# Patient Record
Sex: Male | Born: 1935 | Race: White | Hispanic: No | Smoking: Never smoker
Health system: Southern US, Community
[De-identification: ages and names within clinical notes are randomized; demographics above are authoritative.]

## PROBLEM LIST (undated history)

## (undated) DIAGNOSIS — E785 Hyperlipidemia, unspecified: Secondary | ICD-10-CM

## (undated) DIAGNOSIS — F329 Major depressive disorder, single episode, unspecified: Secondary | ICD-10-CM

## (undated) DIAGNOSIS — Z7901 Long term (current) use of anticoagulants: Secondary | ICD-10-CM

## (undated) DIAGNOSIS — I10 Essential (primary) hypertension: Secondary | ICD-10-CM

## (undated) DIAGNOSIS — I259 Chronic ischemic heart disease, unspecified: Secondary | ICD-10-CM

## (undated) DIAGNOSIS — I4891 Unspecified atrial fibrillation: Secondary | ICD-10-CM

## (undated) DIAGNOSIS — F32A Depression, unspecified: Secondary | ICD-10-CM

## (undated) DIAGNOSIS — E119 Type 2 diabetes mellitus without complications: Secondary | ICD-10-CM

## (undated) DIAGNOSIS — I2510 Atherosclerotic heart disease of native coronary artery without angina pectoris: Secondary | ICD-10-CM

## (undated) HISTORY — PX: CORONARY ARTERY BYPASS GRAFT: SHX141

## (undated) HISTORY — DX: Type 2 diabetes mellitus without complications: E11.9

## (undated) HISTORY — DX: Unspecified atrial fibrillation: I48.91

## (undated) HISTORY — DX: Chronic ischemic heart disease, unspecified: I25.9

## (undated) HISTORY — PX: TONSILLECTOMY AND ADENOIDECTOMY: SUR1326

## (undated) HISTORY — DX: Major depressive disorder, single episode, unspecified: F32.9

## (undated) HISTORY — DX: Hyperlipidemia, unspecified: E78.5

## (undated) HISTORY — DX: Long term (current) use of anticoagulants: Z79.01

## (undated) HISTORY — DX: Essential (primary) hypertension: I10

## (undated) HISTORY — DX: Depression, unspecified: F32.A

## (undated) HISTORY — DX: Atherosclerotic heart disease of native coronary artery without angina pectoris: I25.10

---

## 1987-09-03 HISTORY — PX: CARDIAC CATHETERIZATION: SHX172

## 1997-10-26 ENCOUNTER — Other Ambulatory Visit: Admission: RE | Admit: 1997-10-26 | Discharge: 1997-10-26 | Payer: Self-pay | Admitting: Cardiology

## 1998-09-16 HISTORY — PX: DOPPLER ECHOCARDIOGRAPHY: SHX263

## 1999-03-05 ENCOUNTER — Emergency Department (HOSPITAL_COMMUNITY): Admission: EM | Admit: 1999-03-05 | Discharge: 1999-03-05 | Payer: Self-pay | Admitting: Emergency Medicine

## 1999-03-13 ENCOUNTER — Emergency Department (HOSPITAL_COMMUNITY): Admission: EM | Admit: 1999-03-13 | Discharge: 1999-03-13 | Payer: Self-pay | Admitting: Emergency Medicine

## 2000-03-03 ENCOUNTER — Other Ambulatory Visit: Admission: RE | Admit: 2000-03-03 | Discharge: 2000-03-03 | Payer: Self-pay | Admitting: Urology

## 2000-03-03 ENCOUNTER — Encounter (INDEPENDENT_AMBULATORY_CARE_PROVIDER_SITE_OTHER): Payer: Self-pay | Admitting: Specialist

## 2000-03-10 ENCOUNTER — Encounter: Admission: RE | Admit: 2000-03-10 | Discharge: 2000-06-08 | Payer: Self-pay | Admitting: Radiation Oncology

## 2001-04-12 ENCOUNTER — Encounter: Payer: Self-pay | Admitting: Orthopedic Surgery

## 2001-04-12 ENCOUNTER — Ambulatory Visit (HOSPITAL_COMMUNITY): Admission: RE | Admit: 2001-04-12 | Discharge: 2001-04-12 | Payer: Self-pay | Admitting: Orthopedic Surgery

## 2001-05-12 ENCOUNTER — Observation Stay (HOSPITAL_COMMUNITY): Admission: RE | Admit: 2001-05-12 | Discharge: 2001-05-13 | Payer: Self-pay | Admitting: Orthopedic Surgery

## 2003-02-27 ENCOUNTER — Encounter: Payer: Self-pay | Admitting: Orthopedic Surgery

## 2003-03-05 ENCOUNTER — Observation Stay (HOSPITAL_COMMUNITY): Admission: RE | Admit: 2003-03-05 | Discharge: 2003-03-06 | Payer: Self-pay | Admitting: Orthopedic Surgery

## 2003-04-10 ENCOUNTER — Ambulatory Visit (HOSPITAL_COMMUNITY): Admission: RE | Admit: 2003-04-10 | Discharge: 2003-04-11 | Payer: Self-pay | Admitting: Ophthalmology

## 2004-02-07 ENCOUNTER — Ambulatory Visit (HOSPITAL_COMMUNITY): Admission: RE | Admit: 2004-02-07 | Discharge: 2004-02-07 | Payer: Self-pay | Admitting: Gastroenterology

## 2005-06-08 HISTORY — PX: CORONARY STENT PLACEMENT: SHX1402

## 2005-09-26 ENCOUNTER — Inpatient Hospital Stay (HOSPITAL_COMMUNITY): Admission: EM | Admit: 2005-09-26 | Discharge: 2005-09-30 | Payer: Self-pay | Admitting: Emergency Medicine

## 2005-09-28 HISTORY — PX: CARDIAC CATHETERIZATION: SHX172

## 2006-07-16 HISTORY — PX: CARDIOVASCULAR STRESS TEST: SHX262

## 2007-05-10 ENCOUNTER — Ambulatory Visit (HOSPITAL_COMMUNITY): Admission: RE | Admit: 2007-05-10 | Discharge: 2007-05-10 | Payer: Self-pay | Admitting: Ophthalmology

## 2010-02-14 ENCOUNTER — Ambulatory Visit: Payer: Self-pay | Admitting: Cardiology

## 2010-02-14 ENCOUNTER — Encounter: Admission: RE | Admit: 2010-02-14 | Discharge: 2010-02-14 | Payer: Self-pay | Admitting: Cardiology

## 2010-06-26 ENCOUNTER — Ambulatory Visit: Payer: Self-pay | Admitting: Cardiology

## 2010-10-21 NOTE — Op Note (Signed)
NAME:  Rodney Montes, Rodney Montes             ACCOUNT NO.:  [id number]   MEDICAL RECORD NO.:  [id number]          PATIENT TYPE:  AMB   LOCATION:  SDS                          FACILITY:  [facility]   PHYSICIAN:  Beulah Gandy. Ashley Royalty, M.D. DATE OF BIRTH:  [birth year]   DATE OF PROCEDURE:  05/10/2007  DATE OF DISCHARGE:                               OPERATIVE REPORT   ADMISSION DIAGNOSIS:  Vitreous hemorrhage, left eye.   PROCEDURES:  Pars plana vitrectomy with 25-gauge system, retinal  photocoagulation, left eye.   SURGEON:  Beulah Gandy. Ashley Royalty, M.D.   ASSISTANT:  Bryan Lemma. Lundquist, P.A.   ANESTHESIA:  General.   DETAILS:  Usual prep and drape, 25-gauge trocars placed at 10, 2 and  o'clock 4 mm back from the limbus.  The infusion was placed at 4  o'clock.  Provisc placed on the corneal surface.  The lighted pick and  the vitreous cutter were positioned in the eye and the pars plana  vitrectomy was begun just behind the crystalline lens.  Blood and  vitreous were encountered and carefully removed under low suction and  rapid cutting.  The vitrectomy was carried down to the macular surface,  where the vitreous was trimmed.  The vitrectomy was carried down to the  vitreous base for 360 degrees and blood was removed from the surface of  the scleral buckle.  A 360-degree scleral buckle was in place.  Once  this was accomplished, the endolaser was positioned in the eye and 907  burns were placed around the retinal periphery with a power of 1000  milliwatts, 1000 microns each and 0.1 second each.  A washout procedure  was performed.  The instruments were removed from the eye, the 25-gauge  trocars were removed.  Polymyxin and gentamicin were irrigated into  Tenon's space.  Marcaine was injected around the globe for postop pain.  TobraDex ophthalmic ointment, a patch and shield were placed, Decadron  10 mg was injected into the lower subconjunctival space.  Closing  pressure was 15 with a Barraquer  tonometer.   COMPLICATIONS:  None.   DURATION:  25 minutes.   The patient was awakened and taken to recovery in satisfactory  condition.      Beulah Gandy. Ashley Royalty, M.D.  Electronically Signed     JDM/MEDQ  D:  05/10/2007  T:  05/11/2007  Job:  [id number]

## 2010-10-24 NOTE — Discharge Summary (Signed)
NAME:  Rodney Montes, HUFSTETLER NO.:  [id number]   MEDICAL RECORD NO.:  [id number]          PATIENT TYPE:  INP   LOCATION:  6529                         FACILITY:  [facility]   PHYSICIAN:  Vesta Mixer, M.D. DATE OF BIRTH:  August 03, 1922   DATE OF ADMISSION:  09/26/2005  DATE OF DISCHARGE:  09/30/2005                                 DISCHARGE SUMMARY   DISCHARGE DIAGNOSES:  1.  Coronary artery disease.  He is status post PTCA and stenting of the      saphenous vein graft to the right coronary artery.  2.  History of coronary artery bypass grafting.  3.  Hyperlipidemia.  4.  Hypertension.  5.  History of chronic anticoagulation.   DISCHARGE MEDICATIONS:  1.  Aspirin 81 mg a day.  2.  Multivitamin once a day.  3.  Zetia 10 mg a day.  4.  Colace 100 mg a day.  5.  Simvastatin 40 mg a day.  6.  Toprol XL 50 mg a day.  7.  Lasix 20 mg every other day.  8.  Plavix 75 mg a day.  9.  Nitroglycerin as needed.  10. Coumadin as directed by the [place].   Patient will return to see Dr. Patty Sermons in approximately one to two weeks.  He can see Dr. Elease Hashimoto as well.   HISTORY:  Mr. [name] is an 77 year old gentleman with a history of coronary  artery disease.  He was admitted for symptoms of unstable angina.  Please  see dictated H&P for further details.   HOSPITAL COURSE:  CHEST PAIN:  The patient was supposed to be electively  admitted on Monday for angioplasty.  He ended up developing unstable angina  and was admitted on Sunday.  He underwent diagnostic heart catheterization  on Monday and was found to have a tight stenosis in the saphenous vein graft  to the right coronary artery.  Because of his age and because of the amount  of dye used the procedure was staged.  We did the procedure with angioplasty  on the following day.  He underwent successful PTCA and stenting of the  saphenous vein graft.  We used a filter wire to catch the debris from the  old graft.  The patient tolerated  the procedure quite well and did not have  any episodes of chest pain or shortness of breath.  He is discharged now in  satisfactory condition.  We have  added Plavix.  We will add Coumadin back to his medical regimen starting  tonight.  We have also changed his Lovastatin to simvastatin and I have  changed his Corgard to Toprol XL.  He will return to see Dr. Elease Hashimoto or Dr.  Patty Sermons in one to two weeks.           ______________________________  Vesta Mixer, M.D.     PJN/MEDQ  D:  09/30/2005  T:  10/01/2005  Job:  [id number]   cc:   Cassell Clement, M.D.  Fax: [phone]

## 2010-10-24 NOTE — H&P (Signed)
NAME:  LOUISE, VICTORY NO.:  [id number]   MEDICAL RECORD NO.:  [id number]          PATIENT TYPE:  INP   LOCATION:  1825                         FACILITY:  [facility]   PHYSICIAN:  Peter M. Swaziland, M.D.  DATE OF BIRTH:  08/21/1922   DATE OF ADMISSION:  09/26/2005  DATE OF DISCHARGE:                                HISTORY & PHYSICAL   For complete history and physical, past medical history, social history and  family history, please refer to dictated H&P of September 23, 2005 by Dr. Kristeen Miss. Mr. Bembenek is an 77 year old white male who presents to the  emergency room this morning with chest pain refractory to nitroglycerin at  home. He has had progressive angina for the past 2 months with exertion. He  is status post coronary artery bypass surgery x3 18 years ago. He saw Dr.  Elease Hashimoto on September 23, 2005 and was set up for cardiac catheterization on  Monday. His Coumadin has been on hold. Yesterday he had chest pain  throughout the day at rest. This was relieved temporarily with sublingual  nitroglycerin. This morning he again had refractory chest pain taking up to  5 sublingual nitroglycerin without relief of this pain. He presented to the  emergency room. He is currently pain free. Again for details of past medical  history, social history, and family history see other H&P.   PHYSICAL EXAMINATION:  GENERAL:  The patient is an elderly white male in no  distress.  VITAL SIGNS:  Blood pressure 143/81, pulse is 101 and irregular. He is  afebrile. Sats 99% on 2 liters nasal cannula.  HEENT:  Normocephalic, atraumatic. Pupils equal, round, reactive to light  and accommodation. Extraocular movements are full. Oropharynx is clear.  NECK:  Without JVD, adenopathy, thyromegaly or bruits.  LUNGS:  Clear.  CARDIAC:  Exam reveals irregular rate and rhythm without gallop, murmur, rub  or click.  ABDOMEN:  Soft, nontender without masses or bruits.  EXTREMITIES:  Without edema.  Pulses were 2+ and symmetric.  NEUROLOGIC:  Exam is nonfocal.   LABORATORY DATA:  ECG shows atrial fibrillation with diffuse ST-T wave  changes that are nonspecific. Sodium is 139, potassium 4.0, chloride 108,  CO2 22, BUN 14, creatinine 0.9, glucose 143, hemoglobin 15.6. Point of care  cardiac enzymes are negative x2. Chest x-ray shows mild right lower lobe  atelectasis.   IMPRESSION:  1.  Unstable angina status post CABG 18 years ago.  2.  Chronic atrial fibrillation.  3.  Hypertension  4.  Hypercholesterolemia.  5.  Prior transient ischemic attack.   PLAN:  The patient will be admitted to telemetry monitoring and will rule  out for myocardial infarction. He will be treated with IV nitroglycerin, IV  heparin. Will plan on proceeding with cardiac catheterization on Monday per  Dr. Elease Hashimoto.           ______________________________  Peter M. Swaziland, M.D.     PMJ/MEDQ  D:  09/26/2005  T:  09/26/2005  Job:  [id number]   cc:   Cassell Clement, M.D.  Fax: [phone]   Deloris Ping.  Nahser, M.D.  Fax: [phone]

## 2010-10-24 NOTE — Cardiovascular Report (Signed)
NAME:  Rodney Montes, Rodney Montes NO.:  [id number]   MEDICAL RECORD NO.:  [id number]          PATIENT TYPE:  INP   LOCATION:  4714                         FACILITY:  [facility]   PHYSICIAN:  Vesta Mixer, M.D. DATE OF BIRTH:  [birth year]   DATE OF PROCEDURE:  09/28/2005  DATE OF DISCHARGE:                              CARDIAC CATHETERIZATION   HISTORY OF PRESENT ILLNESS:  Rodney Montes is an 77 year old gentleman  with history of coronary artery bypass grafting by Dr. Particia Lather in  1989.  He has been doing fairly well but started having some episodes of  chest pain several weeks ago.  He was seen in the office last Wednesday.  His Coumadin was stopped, and he was scheduled to have a heart  catheterization on Monday.  He presented with unstable angina over the  weekend and had a very small bump in his cardiac enzymes.  He is scheduled  now for heart catheterization.   PROCEDURES:  Left heart catheterization with coronary angiography.  The  right femoral artery was easily cannulated using the modified Seldinger  technique.  There was a little bit of difficulty getting the wire up into  the central circulation, and he developed a small to moderate sized hematoma  at the end of the case.   HEMODYNAMIC DATA:  LV pressure is 115/8 with an aortic pressure of 115/60.   ANGIOGRAPHY:  THE LEFT MAIN:  The left main coronary artery has minor  luminal irregularities.  THE LEFT ANTERIOR DESCENDING ARTERY:  Gives off a septal perforator branch  and then a very small diagonal branch and then it is occluded.  THE LEFT CIRCUMFLEX ARTERY:  A moderate to large vessel.  There is a diffuse  70% stenosis at the takeoff of the circumflex artery followed by a 70-80%  complex lesion at the takeoff of the small first obtuse marginal branch.  The circumflex artery then continues with diffuse 60-70% stenosis.  It is  then occluded just after giving off the second obtuse marginal branch.  There  is a very tiny branch that travels around the AV groove, but it does  not supply any significant circulation to the left ventricle.  RIGHT CORONARY ARTERY:  Occluded proximally.  Saphenous vein graft to the  first obtuse marginal artery is a very nice graft. The anastomosis to the  first OM is normal, but the marginal vessel has significant irregularities.  The marginal branch is subtotally occluded just proximal to the anastomosis.  Slightly proximal to the anastomosis is a small branch with an 80-90%  stenosis at its takeoff.  This branch would be very difficult to wire in  that the wire would have to go up the first OM in a retrograde fashion and  then back down the OM vessel itself.  I do not think that this lesion is  approachable by angioplasty.  Furthermore, the vessel was probably 1.5 mm in  size.  First obtuse marginal is occluded distal to the anastomosis after  about 2-3 cm.  The right coronary artery is a very large vessel.  There is a  mid  stenosis of approximately 90%.  There is some haziness associated with  this lesion.  The anastomosis of the right coronary artery is fairly normal.  There are minor luminal irregularities in the right coronary artery.  The  posterior descending artery and the posterior lateral segment artery have  minor luminal irregularities but are otherwise patent.  This is a fairly  large system.  LEFT INTERNAL MAMMARY ARTERY:  The left internal mammary artery is a nice  vessel.  The anastomosis of the LAD is normal.  The LAD has minor luminal  irregularities.   STUDIES:  A left ventriculogram was performed in a 30 REO position.  It  revealed normal left ventricular systolic function with an ejection fraction  of 60%.   COMPLICATIONS:  None.   CONCLUSIONS:  1.  Severe native coronary artery disease.  He has patent grafts to the      first obtuse marginal artery, although the first OM has significant      disease just distal to the graft site.  2.   Saphenous vein graft to the right coronary artery has a tight 90%      stenosis.  We will anticipate doing a PCI tomorrow.  We have used a      considerable amount of contrast.  I anticipate that this procedure will      be somewhat longer than average.  We will need to use a filter wire so      that the anatomy is favorable.  In addition, he is 77 years old and has      had some risk for renal problems if we use too much contrast.  3.  Left internal mammary artery is a very nice graft.  He has normal left      ventricular systolic function.           ______________________________  Vesta Mixer, M.D.     PJN/MEDQ  D:  09/28/2005  T:  09/29/2005  Job:  [id number]

## 2010-10-24 NOTE — H&P (Signed)
NAME:  Rodney Montes, Rodney Montes NO.:  [id number]   MEDICAL RECORD NO.:  [id number]           PATIENT TYPE:   LOCATION:                                 FACILITY:   PHYSICIAN:  Vesta Mixer, M.D.      DATE OF BIRTH:   DATE OF ADMISSION:  09/28/2005  DATE OF DISCHARGE:                                HISTORY & PHYSICAL   Mr. Fell is an 77 year old gentleman with a history of coronary artery  disease.  He is status post coronary artery bypass grafting back in 1989.  He also has a history of chronic atrial fibrillation.  He is followed for  his Coumadin checks down in the [place].   He has been having some episodes of angina recently.  He states that the  angina originally was occurring with moderate exertion, but now he has been  having episodes of chest pain with very minimal exertion.  He took several  nitroglycerin, which helped transiently.  This morning he had chest pain  just walking from room to room.  He called Dr. Patty Sermons to be seen.   CURRENT MEDICATIONS:  1.  Coumadin as directed by the [place].  2.  Corgard 20 mg a day.  3.  Aspirin once daily.  4.  Lasix 20 mg every other day.  5.  Lovastatin 40 mg a day.  6.  Multivitamins once daily.  7.  Zetia 10 mg a day.  8.  Toprol XL 50 mg a day.   ALLERGIES:  None.   PAST MEDICAL HISTORY:  1.  History of coronary artery disease, status post CABG.  2.  History of a TIA.  3.  Chronic atrial fibrillation.  4.  Hypertension.  5.  Hyperlipidemia.   SOCIAL HISTORY:  The patient quit smoking many years ago.  He does not drink  alcohol to excess.  He used to work in the [employer]  business.   FAMILY HISTORY:  Noncontributory.   REVIEW OF SYSTEMS:  The patient denies any problems with his eyes, ears,  nose, and throat.  He denies any heat or cold intolerance.  He denies any  weight gain or weight loss.  He denies any PND, orthopnea, syncope, or  presyncope.  He does have chest pain and shortness  of breath, as noted  above.  He denies any diaphoresis.  He denies any problems with his GU or GI  system.  He denies any easy bruisability.  His review of systems was  reviewed and is otherwise negative.   PHYSICAL EXAMINATION:  VITAL SIGNS:  His weight is 202.  His blood pressure  is 150/80 with a heart rate of 76.  GENERAL:  He is an elderly gentleman in no acute distress.  He is alert and  oriented x3, and his mood and affect are normal.  NECK:  Carotids 2+.  He has no bruits.  No JVD.  No thyromegaly.  LUNGS:  Clear to auscultation.  HEART:  Irregularly irregular.  He has a soft murmur.  ABDOMEN:  Good bowel sounds and is nontender.  EXTREMITIES:  He has no clubbing, cyanosis or edema.  NEUROLOGIC:  Nonfocal.   His EKG from the other day reveals atrial fibrillation with a controlled  ventricular response.   His pro time today is 20.5 with an INR of 2.4.  His other laboratory data  were drawn on Monday and are fairly unremarkable.   Mr. Burgo presents with worsening angina.  I think that we should proceed  with a repeat heart catheterization.  It has been 18 years since his last  heart catheterization and bypass grafting.  We have discussed the risks,  benefits and options of heart catheterization.  He understands and agrees to  proceed.  We will hold his Coumadin until Monday.  We will recheck his pro  time again Monday morning.           ______________________________  Vesta Mixer, M.D.     PJN/MEDQ  D:  09/23/2005  T:  09/23/2005  Job:  [id number]   cc:   Cassell Clement, M.D.  Fax: [phone]

## 2010-10-24 NOTE — Cardiovascular Report (Signed)
NAME:  Rodney Montes, Rodney Montes NO.:  [id number]   MEDICAL RECORD NO.:  [id number]          PATIENT TYPE:  INP   LOCATION:  6529                         FACILITY:  [facility]   PHYSICIAN:  Vesta Mixer, M.D. DATE OF BIRTH:  Aug 30, 1922   DATE OF PROCEDURE:  09/29/2005  DATE OF DISCHARGE:                              CARDIAC CATHETERIZATION   HISTORY:  [name] is an 77 year old gentleman with a history of coronary  artery disease.  He is status post coronary artery bypass grafting 18 years  ago.  He was recently admitted with symptoms of unstable angina.  He had a  diagnostic heart catheterization yesterday and was found to have a tight  stenosis in the saphenous vein graft to his right coronary artery.  He was  scheduled for PCI of that lesion today.   The patient was prepped and draped in the usual sterile fashion.     The right femoral artery was easily cannulated using a modified Seldinger  technique.  A 7-French sheath was placed.  The saphenous vein graft to the  right coronary artery was cannulated using a 7-French Judkins right 4 guide.  A filter wire was placed in the distal aspect of the saphenous vein graft  clearly distal to the lesion.   At this point, a 3 x 16 mm Taxus stent was deployed at the site of the  stenosis.  It was deployed at 16 atmospheres for 40 seconds.  This resulted  in a marked improvement of the vessel lumen, but there was still a slight  residual stenosis.  At this point, a 3.5 x 15 mm Quantum Monorail was  positioned across the stent.  It was inflated up to 12 atmospheres for 27  seconds for post-stent dilatation.  This resulted in a very nice  angiographic result, with a 0% residual stenosis.  The filter wire was  removed in the standard fashion.  There was no evidence of embolization.  The patient remained stable, and there were no EKG changes.   COMPLICATIONS:  None.   CONCLUSION:  Successful PTCA and stenting of the saphenous vein  graft to the  right coronary artery using filter wire protection.  We will anticipate  discharge tomorrow.           ______________________________  Vesta Mixer, M.D.     PJN/MEDQ  D:  09/29/2005  T:  09/30/2005  Job:  [id number]   cc:   Cassell Clement, M.D.  Fax: [phone]

## 2010-10-24 NOTE — Op Note (Signed)
NAME:  Rodney Montes, Rodney Montes                   ACCOUNT NO.:  [id number]   MEDICAL RECORD NO.:  [id number]                   PATIENT TYPE:  AMB   LOCATION:  ENDO                                 FACILITY:  [facility]   PHYSICIAN:  Graylin Shiver, M.D.                DATE OF BIRTH:  Nov 10, 1922   DATE OF PROCEDURE:  02/07/2004  DATE OF DISCHARGE:                                 OPERATIVE REPORT   PROCEDURE PERFORMED:  Colonoscopy.   INDICATIONS FOR PROCEDURE:  Screening.   Informed consent was obtained after explanation of the risks of bleeding,  infection, and perforation, possible stroke with the patient coming off of  Coumadin.   PREMEDICATIONS:  Fentanyl 50 mcg  IV, Versed 5 mg IV.   DESCRIPTION OF PROCEDURE:  With the patient in the left lateral decubitus  position, a rectal exam was performed and no masses were felt.  The Olympus  colonoscope was inserted into the rectum and advanced around the colon to  the cecum.  Cecal landmarks were identified.  The ileocecal valve was  intubated and the first few centimeters of terminal ileum looked normal.  The cecum and ascending colon looked normal.  The transverse colon looked  normal.  The descending colon and sigmoid revealed scattered diverticula.  The rectum was normal.  The patient tolerated the procedure well without  complications.   IMPRESSION:  Diverticulosis of the left colon.                                               Graylin Shiver, M.D.    Rodney Montes  D:  02/07/2004  T:  02/07/2004  Job:  [id number]   cc:   Cassell Clement, M.D.  [address], [address]  [place]  [place] [address]  Fax: [phone]

## 2010-10-24 NOTE — Op Note (Signed)
[facility]  Patient:    SAADIQ, POCHE Visit Number: [id number] MRN: [id number]          Service Type: SUR Location: [id number] Attending Physician:  Marlowe Kays Page Proc. Date: 05/12/01 Admit Date:  05/12/2001                             Operative Report  PREOPERATIVE DIAGNOSIS:  Torn rotator cuff left shoulder.  POSTOPERATIVE DIAGNOSIS:  Torn rotator cuff left shoulder.  PROCEDURE PERFORMED:  Anterior acromionectomy and repair of torn rotator cuff left shoulder.  SURGEON:  Illene Labrador. Aplington, M.D.  ANESTHESIA:  General.  INDICATION:  Progressive pain left shoulder of four to five months duration. MRI has demonstrated a large retracted tear of the rotator cuff at surgery. This measured about 1.5 cm of tear and 1.5 cm of retraction.  The tendon was flexible.  DESCRIPTION OF PROCEDURE:  Prophylactic antibiotics.  Satisfactory general anesthesia.  Placed on the Schlein frame in the beach chair position.  The left shoulder girdle was prepped with Duraprep and draped in a sterile field. Ioban employed.  A vertical incision from [facility] joint down to the greater tuberosity. The fascia over the anterior acromion was opened from the [facility] joint down splitting the deltoid for about 1 cm and reflected anteriorly and posteriorly.  The [facility] joint was identified anteriorly with the [facility] needle and protecting it, I made my attentive line for anterior acromionectomy with the Bovie.  I then undermined the anterior acromion and protecting the underlying rotator cuff with the Cobb elevator, I made my initial removal of the anterior acromion and coracoacromial ligament.  He had a very thick acromion with substantial impingement problem and I made a second inferiorly beveled cut removing additional bone and then smoothed up additional bone and removal of bursal tissue with a small rongeur.  This completely relieved this impingement.  He had a fairly  exuberant subdeltoid bursa which I resected. The rotator cuff tear was then inspected.  The long head of the biceps tendon was intact.  There was no substantial retraction of the undersurface of the rotator cuff with the whole rotator cuff retracting as a unit.  I created a bony trough laterally with a .5 inch curved osteotome and small rongeur and made four lateral drill holes.  In the two center holes, I placed my initial stitch in the center portion of the rotator cuff tear with a horizontal mattress suture of #1 Ethibond.  I then made a horizontal mattress suture anteriorly and posteriorly using the first and fourth holes and rotator cuff. With the arm abducted, I then tied these securely and then reinforced this with an additionally lateral suture through the rotator cuff and plentiful rotator cuff tissue attached to the greater tuberosity with interrupted #1 Vicryl.  This gave a nice secure repair.  He had an interscalene block by the anesthesiologist prior to the general anesthetic.  I supplemented this with 0.5% Marcaine with adrenalin after irrigating the wound well with sterile saline.  The deltoid muscle I then approximated in two layers with interrupted #1 Vicryl and the fascia over the anterior acromion with one layer of interrupted #1 Vicryl and a nice secure repair.  The subcutaneous tissue was closed with 2-0 Vicryl and the skin with staples.  Betadine and Adaptic dry sterile dressings were applied.  He was placed in a shoulder immobilizer.  He tolerated the procedure well.  At the time of this dictation, he was on his way to the recovery room in satisfactory condition with known complication. Attending Physician:  Joaquin Courts DD:  05/12/01 TD:  05/12/01 Job: [id number]

## 2010-10-24 NOTE — Op Note (Signed)
NAME:  ZADIEL, LEYH                   ACCOUNT NO.:  [id number]   MEDICAL RECORD NO.:  [id number]                   PATIENT TYPE:  OIB   LOCATION:  5731                                 FACILITY:  [facility]   PHYSICIAN:  Beulah Gandy. Ashley Royalty, M.D.              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   OPERATION PERFORMED:  1. Scleral buckle, left eye.  2. Retinal photocoagulation, left eye.  3. Perfluoropropane injection, left eye.   SURGEON:  Beulah Gandy. Ashley Royalty, M.D.   ASSISTANT:  Merian Capron MA   ANESTHESIA:  General.   DESCRIPTION OF PROCEDURE:  Usual prep and drape, 360 degree limbal peritomy,  isolation of four rectus muscles on 2-0 silk, localization of break at 11  o'clock and 4 o'clock.  Cryopexy placed around the 11 o'clock break.  Scleral dissection from 10 o'clock around to 9 o'clock to admit a #279  intrascleral implant.  Diathermy placed in the bed.  Additional posterior  dissection carried out at 4 o'clock.  A 508G segment placed against the  globe.  Two scleral sutures were placed in each quadrant of the scleral  flaps for a total of seven sutures.  Perforation site chosen at 4 o'clock  with a large amount of clear, colorless subretinal fluid coming forth.  The  buckle elements were placed. A 240 band was  placed around the eye with a  270 sleeve at 10 o'clock.  Perfluoropropane 60% 1.38mL was injected in the  vitreous cavity to reinflate the globe.  Indirect ophthalmoscopy showed the  retina to be lying nicely on the scleral buckle with the breaks well  supported.  The indirect ophthalmoscope laser was moved into place.  223  burns were placed on the scleral buckle with a power of , 1000 microns  each and 0.1  second each.  The band was adjusted and trimmed.  The buckle  was adjusted and trimmed.  Sutures were knotted and trimmed.  The  conjunctiva was reposited with 7-0 chromic suture.  Polymyxin and gentamicin  were irrigated into Tenon's space.  Marcaine was injected around the globe  for postoperative pain.  Decadron 10 mg was injected into the lower  subconjunctival space.  Atropine solution was applied as well as Polysporin,  a patch and a shield.  The closing tension was 15 with a [place]  tonometer.   COMPLICATIONS:  None.   DURATION:  Two hours.   The patient was awakened and taken to recovery in satisfactory condition.  Beulah Gandy. Ashley Royalty, M.D.   JDM/MEDQ  D:  04/10/2003  T:  04/11/2003  Job:  [id number]

## 2010-10-24 NOTE — Op Note (Signed)
NAME:  Rodney Montes, Rodney Montes                   ACCOUNT NO.:  [id number]   MEDICAL RECORD NO.:  [id number]                   PATIENT TYPE:  AMB   LOCATION:  DAY                                  FACILITY:  [facility]   PHYSICIAN:  Georges Lynch. Gioffre, M.D.             DATE OF BIRTH:  1922/12/17   DATE OF PROCEDURE:  03/05/2003  DATE OF DISCHARGE:                                 OPERATIVE REPORT   PREOPERATIVE DIAGNOSES:  1. Complete tear of his right rotator cuff tendon.  2. Severe impingement syndrome right shoulder resulting in the above     complete tear of the rotator cuff.   POSTOPERATIVE DIAGNOSES:  1. Complete tear of his right rotator cuff tendon.  2. Severe impingement syndrome right shoulder resulting in the above     complete tear of the rotator cuff.   OPERATION:  1. Partial acromionectomy and acromioplasty on the right shoulder.  2. Repair of the rotator cuff tendon tear utilizing a graft jacket 5 x 5 mm     double thickness with two bone anchors.   SURGEON:  Georges Lynch. Darrelyn Hillock, M.D.   ASSISTANT:  Ebbie Ridge. Paitsel, P.A.   DESCRIPTION OF PROCEDURE:  Routine orthopedic prep and draping of the right  upper extremity was carried out. The patient had 1 g of IV Ancef. An  incision was made over the anterior aspect of the right shoulder and  bleeders were identified and cauterized. At this time, I identified the  acromion and the deltoid tendon. I stripped the deltoid tendon off by sharp  dissection. I then split the deltoid muscle proximally and identified the  acromion and the bursa. The bursa was quite thickened. I did a bursectomy. I  identified a large central tear and hole actually that was present in his  rotator cuff tendon. I noted that his acromion was markedly thickened and  beak shaped and literally when one brought the shoulder up into abduction  this beak of bone was directly into the hole that was created in the rotator  cuff. I then protected the cuff with the  Bennett retractor, utilized the  oscillating saw and did a partial excision of the acromion. I then utilized  the bur to even out the raw bone end. I thoroughly irrigated out the area. I  then burred the lateral articular surface of the humerus where the defect  was. I then inserted two metal anchors into the proximal humerus and then I  utilized a 5 x 5 mm graft jacket double thickness, sutured it to the tendon.  I then anchored it down to the anchors in the bone. We then utilized some  sutures from medial to laterally as well. I thoroughly irrigated out the  area and then reapproximated the deltoid tendon and muscle in the usual  fashion. The subcu was closed with #0 Vicryl, skin was closed with metal  staples and a sterile Neosporin dressing is applied. He  was then placed in a  shoulder immobilizer. Note he has been off his Coumadin for 5-7 days preop  and he will be started back on his Coumadin tomorrow.                                               Ronald A. Darrelyn Hillock, M.D.    RAG/MEDQ  D:  03/05/2003  T:  03/05/2003  Job:  [id number]

## 2011-02-27 ENCOUNTER — Encounter: Payer: Self-pay | Admitting: Cardiology

## 2011-03-12 ENCOUNTER — Ambulatory Visit (INDEPENDENT_AMBULATORY_CARE_PROVIDER_SITE_OTHER): Payer: Medicare Other | Admitting: Cardiology

## 2011-03-12 ENCOUNTER — Encounter: Payer: Self-pay | Admitting: Cardiology

## 2011-03-12 VITALS — BP 118/70 | HR 78 | Ht 76.0 in | Wt 209.0 lb

## 2011-03-12 DIAGNOSIS — I4891 Unspecified atrial fibrillation: Secondary | ICD-10-CM | POA: Insufficient documentation

## 2011-03-12 DIAGNOSIS — E119 Type 2 diabetes mellitus without complications: Secondary | ICD-10-CM | POA: Insufficient documentation

## 2011-03-12 DIAGNOSIS — E7800 Pure hypercholesterolemia, unspecified: Secondary | ICD-10-CM

## 2011-03-12 DIAGNOSIS — R5383 Other fatigue: Secondary | ICD-10-CM | POA: Insufficient documentation

## 2011-03-12 NOTE — Assessment & Plan Note (Signed)
The patient is a mild diabetic on metformin.  He is not having any hypoglycemic episodes.  We will check a blood sugar at his next office visit.

## 2011-03-12 NOTE — Progress Notes (Signed)
Tomma Rakers Date of Birth:  13-Jun-1922 [facility] [address]   [address] [place], [place]  [address] [phone]           Fax   [phone]  History of Present Illness: This pleasant, elderly gentleman is seen for scheduled four-month followup office visit.  Past history of ischemic heart disease.  He had coronary bypass graft surgery in 1989.  2007.  He had a drug-eluting stent placed to the saphenous vein graft of the right coronary artery. His last nuclear stress test was in February 2008 and was negative for ischemia.  As the patient has been feeling well with no chest pain or shortness of breath.  He does tire more easily and he no longer plays golf, but he does walk everyday for exercise.  The patient has not been experiencing any TIA symptoms.  Earlier this week.  He spit up some bloody spittle early in the morning and he has cut back on his Coumadin and has had no further symptoms.  He states that his last INR was in the range of 3 he had not been eating a salad meal start a few salads.  We will also empirically cut back his Coumadin slightly  Current Outpatient Prescriptions  Medication Sig Dispense Refill  . Ascorbic Acid (VITAMIN C PO) Take by mouth daily.        . citalopram (CELEXA) 10 MG tablet Take 10 mg by mouth daily.        . clopidogrel (PLAVIX) 75 MG tablet Take 75 mg by mouth daily.        Marland Kitchen ezetimibe (ZETIA) 10 MG tablet Take 10 mg by mouth daily.        . lansoprazole (PREVACID) 30 MG capsule Take 30 mg by mouth daily.        . meclizine (ANTIVERT) 25 MG tablet Take 25 mg by mouth 3 (three) times daily as needed.        . metFORMIN (GLUMETZA) 500 MG (MOD) 24 hr tablet Take 500 mg by mouth daily with breakfast.        . metoprolol (TOPROL-XL) 50 MG 24 hr tablet Take 50 mg by mouth 2 (two) times daily.        . Multiple Vitamin (MULTIVITAMIN) tablet Take 1 tablet by mouth daily.        . nitroGLYCERIN (NITROSTAT) 0.4 MG SL tablet Place  0.4 mg under the tongue every 5 (five) minutes as needed.        . simvastatin (ZOCOR) 80 MG tablet Take 40 mg by mouth at bedtime.        . Vitamin D, Ergocalciferol, (DRISDOL) 50000 UNITS CAPS Take 50,000 Units by mouth every 7 (seven) days.        Marland Kitchen warfarin (COUMADIN) 5 MG tablet Take 5 mg by mouth as directed.          Allergies  Allergen Reactions  . Diltiazem     Patient Active Problem List  Diagnoses  . Atrial fibrillation  . Pure hypercholesterolemia  . Diabetes mellitus without mention of complication  . Malaise and fatigue    History  Smoking status  . Never Smoker   Smokeless tobacco  . Not on file    History  Alcohol Use No    Family History  Problem Relation Age of Onset  . Prostate cancer Father     Review of Systems: Constitutional: no fever chills diaphoresis or fatigue or change in weight.  Head and neck: no hearing loss, no epistaxis, no photophobia or visual disturbance. Respiratory: No cough, shortness of breath or wheezing. Cardiovascular: No chest pain peripheral edema, palpitations. Gastrointestinal: No abdominal distention, no abdominal pain, no change in bowel habits hematochezia or melena. Genitourinary: No dysuria, no frequency, no urgency, no nocturia. Musculoskeletal:No arthralgias, no back pain, no gait disturbance or myalgias. Neurological: No dizziness, no headaches, no numbness, no seizures, no syncope, no weakness, no tremors. Hematologic: No lymphadenopathy, no easy bruising. Psychiatric: No confusion, no hallucinations, no sleep disturbance.    Physical Exam: Filed Vitals:   03/12/11 1105  BP: 118/70  Pulse: 78   the general appearance reveals a well-developed, well-nourished, gentleman in no distress.  Pupils equal and reactive.   Extraocular Movements are full.  There is no scleral icterus.  The mouth and pharynx are normal.  The neck is supple.  The carotids reveal no bruits.  The jugular venous pressure is normal.  The  thyroid is not enlarged.  There is no lymphadenopathy.  The chest is clear to percussion and auscultation. There are no rales or rhonchi. Expansion of the chest is symmetrical.  Heart reveals an irregular rhythm.  There is a soft systolic ejection murmur at base.  No gallop or rub.The abdomen is soft and nontender. Bowel sounds are normal. The liver and spleen are not enlarged. There Are no abdominal masses. There are no bruits.  The pedal pulses are good.  There is no phlebitis or edema.  There is no cyanosis or clubbing. Strength is normal and symmetrical in all extremities.  There is no lateralizing weakness.  There are no sensory deficits.  The skin is warm and dry.  There is no rash.    Assessment / Plan:   Continue same medication.  Since he has not been getting anything other than his protimes checked at the [place] we will get complete labs at his next visit.  We'll also do an EKG.

## 2011-03-12 NOTE — Assessment & Plan Note (Addendum)
The patient does note some increased malaise and fatigue.  He is not having any myalgias in his legs however.  When he returns for his next office visit.  We will check a CBC and TSH

## 2011-03-12 NOTE — Assessment & Plan Note (Signed)
The patient has not been expressing any thromboembolic symptoms.

## 2011-03-12 NOTE — Assessment & Plan Note (Signed)
The patient is on Zovia, and simvastatin.  We will plan to check fasting lipids next visit.  He has not been getting them checked at the [place] when he gets his protimes

## 2011-03-12 NOTE — Patient Instructions (Signed)
continue same dose of medications  Your physician wants you to follow-up in: 4 months You will receive a reminder letter in the mail two months in advance. If you don't receive a letter, please call our office to schedule the follow-up appointment.  

## 2011-03-16 LAB — BASIC METABOLIC PANEL
CO2: 27
Calcium: 9.5
Chloride: 103
GFR calc Af Amer: >60
Potassium: 4
Sodium: 140

## 2011-03-16 LAB — PROTIME-INR: Prothrombin Time: 13.4

## 2011-03-16 LAB — APTT: aPTT: 33

## 2011-03-16 LAB — CBC
HCT: 45.3
Hemoglobin: 15.4
MCHC: 33.9
RBC: 4.88

## 2011-07-13 ENCOUNTER — Ambulatory Visit (INDEPENDENT_AMBULATORY_CARE_PROVIDER_SITE_OTHER): Payer: Medicare Other | Admitting: Cardiology

## 2011-07-13 ENCOUNTER — Encounter: Payer: Self-pay | Admitting: Cardiology

## 2011-07-13 VITALS — BP 120/76 | HR 88 | Ht 76.0 in | Wt 217.0 lb

## 2011-07-13 DIAGNOSIS — E119 Type 2 diabetes mellitus without complications: Secondary | ICD-10-CM

## 2011-07-13 DIAGNOSIS — E7800 Pure hypercholesterolemia, unspecified: Secondary | ICD-10-CM

## 2011-07-13 DIAGNOSIS — Z951 Presence of aortocoronary bypass graft: Secondary | ICD-10-CM

## 2011-07-13 DIAGNOSIS — I4891 Unspecified atrial fibrillation: Secondary | ICD-10-CM

## 2011-07-13 NOTE — Patient Instructions (Signed)
Your physician recommends that you continue on your current medications as directed. Please refer to the Current Medication list given to you today. Your physician recommends that you schedule a follow-up appointment in: 4 months with fasting labs (lp/bmet/hfp/a1c) and EKG

## 2011-07-13 NOTE — Assessment & Plan Note (Signed)
The patient has had no TIA symptoms from his atrial fibrillation.  He is on long-term Coumadin and it is managed through the [facility] in [place].

## 2011-07-13 NOTE — Assessment & Plan Note (Signed)
Patient has not been having any hypoglycemic reactions.  He remains on metformin

## 2011-07-13 NOTE — Progress Notes (Signed)
Rodney Montes Date of Birth:  1922/12/03 [facility] [address] [place], [place]  [address] [phone]         Fax   [phone]  History of Present Illness: This pleasant 78 year old gentleman is seen for a scheduled four-month followup office visit.  He has a history of ischemic heart disease.  He had coronary bypass surgery in 1989.  He had recurrent angina in 2007 had a drug-eluting stent placed to the saphenous vein graft to the right coronary artery.  In February 2008 he had a nuclear stress test which was negative.  Since then he has had no recurrent angina pectoris.  Current Outpatient Prescriptions  Medication Sig Dispense Refill  . Ascorbic Acid (VITAMIN C PO) Take by mouth daily.        . citalopram (CELEXA) 10 MG tablet Take 10 mg by mouth daily.        . clopidogrel (PLAVIX) 75 MG tablet Take 75 mg by mouth daily.        Marland Kitchen ezetimibe (ZETIA) 10 MG tablet Take 10 mg by mouth daily.        . lansoprazole (PREVACID) 30 MG capsule Take 30 mg by mouth daily.        . meclizine (ANTIVERT) 25 MG tablet Take 25 mg by mouth 3 (three) times daily as needed.        . metFORMIN (GLUMETZA) 500 MG (MOD) 24 hr tablet Take 500 mg by mouth daily with breakfast.        . metoprolol (TOPROL-XL) 50 MG 24 hr tablet Take 50 mg by mouth 2 (two) times daily.        . Multiple Vitamin (MULTIVITAMIN) tablet Take 1 tablet by mouth daily.        . nitroGLYCERIN (NITROSTAT) 0.4 MG SL tablet Place 0.4 mg under the tongue every 5 (five) minutes as needed.        . simvastatin (ZOCOR) 80 MG tablet Take 40 mg by mouth at bedtime.        . Vitamin D, Ergocalciferol, (DRISDOL) 50000 UNITS CAPS Take 50,000 Units by mouth every 7 (seven) days.        Marland Kitchen warfarin (COUMADIN) 5 MG tablet Take 5 mg by mouth as directed.          Allergies  Allergen Reactions  . Diltiazem     Patient Active Problem List  Diagnoses  . Atrial fibrillation  . Pure hypercholesterolemia  . Diabetes  mellitus without mention of complication  . Malaise and fatigue    History  Smoking status  . Never Smoker   Smokeless tobacco  . Not on file    History  Alcohol Use No    Family History  Problem Relation Age of Onset  . Prostate cancer Father     Review of Systems: Constitutional: no fever chills diaphoresis or fatigue or change in weight.  Head and neck: no hearing loss, no epistaxis, no photophobia or visual disturbance. Respiratory: No cough, shortness of breath or wheezing. Cardiovascular: No chest pain peripheral edema, palpitations. Gastrointestinal: No abdominal distention, no abdominal pain, no change in bowel habits hematochezia or melena. Genitourinary: No dysuria, no frequency, no urgency, no nocturia. Musculoskeletal:No arthralgias, no back pain, no gait disturbance or myalgias. Neurological: No dizziness, no headaches, no numbness, no seizures, no syncope, no weakness, no tremors. Hematologic: No lymphadenopathy, no easy bruising. Psychiatric: No confusion, no hallucinations, no sleep disturbance.    Physical Exam: Filed Vitals:   07/13/11  1520  BP: 120/76  Pulse: 88   The general appearance reveals an afebrile elderly gentleman in no acute distress.Pupils equal and reactive.   Extraocular Movements are full.  There is no scleral icterus.  The mouth and pharynx are normal.  The neck is supple.  The carotids reveal no bruits.  The jugular venous pressure is normal.  The thyroid is not enlarged.  There is no lymphadenopathy.  I will checkThe precordium is quiet.  The first heart sound is normal.  The second heart sound is physiologically split.  There is no murmur gallop rub or click.  There is no abnormal lift or heave.  Rhythm is irregularThe abdomen is soft and nontender. Bowel sounds are normal. The liver and spleen are not enlarged. There Are no abdominal masses. There are no bruits.    Normal extremity without phlebitis or edema.  Pedal pulses are  good.The skin is warm and dry.  There is no rash. Strength is normal and symmetrical in all extremities.  There is no lateralizing weakness.  There are no sensory deficits.    Assessment / Plan: The same medication.  Recheck in 4 months for office visit EKG and fasting lab work including hemoglobin A1c

## 2011-07-13 NOTE — Assessment & Plan Note (Signed)
Patient has a history of hypercholesterolemia.  He is on ezetimibe and simvastatin.  Is not having any myalgias from his statin therapy.  We'll plan to get fasting lab work on him next time since he ate today

## 2011-08-25 ENCOUNTER — Telehealth: Payer: Self-pay | Admitting: Cardiology

## 2011-08-25 NOTE — Telephone Encounter (Signed)
Patient saw Dr. Sherrie Mustache at the [facility] on 08/17/11.  She wrote a note for him to ask Dr. Patty Sermons if he could stop taking Plavix since he is on Coumadin.  Please f/u with patient regarding his medications.  I have made a copy of his 3/11 progress note from his visit with Dr. Marina Goodell.

## 2011-08-26 NOTE — Telephone Encounter (Signed)
Left message

## 2011-08-26 NOTE — Telephone Encounter (Signed)
He needs coumadin for his atrial fib. He is on plavix for his CAD with drug-eluting stent.  If he is having no problems with bleeding etc I would recommend continuing plavix indefinitely unless cost etc is a factor.  We can discuss further at next OV.

## 2011-08-26 NOTE — Telephone Encounter (Signed)
Will forward to  Dr. Brackbill for review 

## 2011-08-26 NOTE — Telephone Encounter (Signed)
Advised patient, verbalized understanding  

## 2011-08-26 NOTE — Telephone Encounter (Signed)
Fu call °Patient returning your call °

## 2011-11-10 ENCOUNTER — Telehealth: Payer: Self-pay | Admitting: *Deleted

## 2011-11-10 ENCOUNTER — Other Ambulatory Visit (INDEPENDENT_AMBULATORY_CARE_PROVIDER_SITE_OTHER): Payer: Medicare Other

## 2011-11-10 ENCOUNTER — Ambulatory Visit (INDEPENDENT_AMBULATORY_CARE_PROVIDER_SITE_OTHER): Payer: Medicare Other | Admitting: Cardiology

## 2011-11-10 ENCOUNTER — Encounter: Payer: Self-pay | Admitting: Cardiology

## 2011-11-10 VITALS — BP 118/67 | HR 72 | Ht 76.0 in | Wt 213.8 lb

## 2011-11-10 DIAGNOSIS — E7800 Pure hypercholesterolemia, unspecified: Secondary | ICD-10-CM

## 2011-11-10 DIAGNOSIS — I4891 Unspecified atrial fibrillation: Secondary | ICD-10-CM

## 2011-11-10 DIAGNOSIS — Z951 Presence of aortocoronary bypass graft: Secondary | ICD-10-CM | POA: Insufficient documentation

## 2011-11-10 DIAGNOSIS — R011 Cardiac murmur, unspecified: Secondary | ICD-10-CM

## 2011-11-10 DIAGNOSIS — R0989 Other specified symptoms and signs involving the circulatory and respiratory systems: Secondary | ICD-10-CM

## 2011-11-10 DIAGNOSIS — R0602 Shortness of breath: Secondary | ICD-10-CM

## 2011-11-10 LAB — CBC WITH DIFFERENTIAL/PLATELET
Basophils Absolute: 0 10*3/uL (ref 0.0–0.1)
Eosinophils Absolute: 0.2 10*3/uL (ref 0.0–0.7)
HCT: 44.8 % (ref 39.0–52.0)
Hemoglobin: 14.8 g/dL (ref 13.0–17.0)
Lymphocytes Relative: 22.2 % (ref 12.0–46.0)
Lymphs Abs: 1.9 10*3/uL (ref 0.7–4.0)
MCHC: 32.9 g/dL (ref 30.0–36.0)
MCV: 94.2 fl (ref 78.0–100.0)
Monocytes Absolute: 0.7 10*3/uL (ref 0.1–1.0)
Neutro Abs: 5.6 10*3/uL (ref 1.4–7.7)
RDW: 14.9 % — ABNORMAL HIGH (ref 11.5–14.6)

## 2011-11-10 LAB — BASIC METABOLIC PANEL
Calcium: 9.2 mg/dL (ref 8.4–10.5)
Creatinine, Ser: 0.8 mg/dL (ref 0.4–1.5)
GFR: 95.84 mL/min (ref >60.00)

## 2011-11-10 LAB — LIPID PANEL
Cholesterol: 99 mg/dL (ref 0–200)
LDL Cholesterol: 42 mg/dL (ref 0–99)
Triglycerides: 59 mg/dL (ref 0.0–149.0)

## 2011-11-10 LAB — HEPATIC FUNCTION PANEL
ALT: 24 U/L (ref 0–53)
Total Bilirubin: 1.2 mg/dL (ref 0.3–1.2)

## 2011-11-10 NOTE — Assessment & Plan Note (Signed)
The patient had a history of chronic atrial fibrillation and is on Coumadin.  His Coumadin is followed at the [facility].  He has had no TIA symptoms or strokes.  He has had some exertional dyspnea.

## 2011-11-10 NOTE — Assessment & Plan Note (Signed)
The patient has had no recurrent angina pectoris since last visit.

## 2011-11-10 NOTE — Patient Instructions (Signed)
Your physician has requested that you have an echocardiogram. Echocardiography is a painless test that uses sound waves to create images of your heart. It provides your doctor with information about the size and shape of your heart and how well your heart's chambers and valves are working. This procedure takes approximately one hour. There are no restrictions for this procedure.  Your physician recommends that you schedule a follow-up appointment in: 4 months   Your physician has recommended you make the following change in your medication:   STOP/HOLD ZOCOR TILL SEEN IN 4 MONTHS   Your physician recommends that you return for a FASTING lipid profile: 4 MONTHS, CBC, LP, HFP BMET

## 2011-11-10 NOTE — Progress Notes (Signed)
Quick Note:  Please report to patient. The recent labs are stable. Continue same medication and careful diet. Cholesterol 99 very low so okay to stop the simvastatin and see if leg pain improves. BS is better also. ______

## 2011-11-10 NOTE — Telephone Encounter (Signed)
Message copied by Antony Odea on Tue Nov 10, 2011  2:33 PM ------      Message from: Cassell Clement      Created: Tue Nov 10, 2011  2:26 PM       Please report to patient.  The recent labs are stable. Continue same medication and careful diet. Cholesterol 99 very low so okay to stop the simvastatin and see if leg pain improves.  BS is better also.

## 2011-11-10 NOTE — Progress Notes (Signed)
Rodney Montes Date of Birth:  1922/12/29 [facility] [address] [place], [place]  [address] [phone]         Fax   [phone]  History of Present Illness: This pleasant 78 year old gentleman is seen for a scheduled four-month followup office visit.  The patient has known ischemic heart disease.  He had coronary artery bypass graft surgery in 1989.  He had recurrent angina in 2007 and had a drug-eluting stent placed to the saphenous vein graft to the right coronary artery.  Every 2008 he had a nuclear stress test which was negative.  Since then he has had no recurrent angina pectoris and has been doing well.  Been known heart number.  His last echocardiogram was in 2000  Current Outpatient Prescriptions  Medication Sig Dispense Refill  . Ascorbic Acid (VITAMIN C PO) Take 500 mg by mouth daily.       . clopidogrel (PLAVIX) 75 MG tablet Take 75 mg by mouth daily.        Marland Kitchen ezetimibe (ZETIA) 10 MG tablet Take 10 mg by mouth daily.        . furosemide (LASIX) 20 MG tablet Take 20 mg by mouth daily.      . lansoprazole (PREVACID) 30 MG capsule Take 30 mg by mouth as needed.       . meclizine (ANTIVERT) 25 MG tablet Take 25 mg by mouth 3 (three) times daily as needed.        . metFORMIN (GLUMETZA) 500 MG (MOD) 24 hr tablet Take 500 mg by mouth daily with breakfast.        . metoprolol (TOPROL-XL) 50 MG 24 hr tablet Take 50 mg by mouth 2 (two) times daily.        . nitroGLYCERIN (NITROSTAT) 0.4 MG SL tablet Place 0.4 mg under the tongue every 5 (five) minutes as needed.        . simvastatin (ZOCOR) 80 MG tablet Take 40 mg by mouth at bedtime.        . Vitamin D, Ergocalciferol, (DRISDOL) 50000 UNITS CAPS Take 50,000 Units by mouth every 7 (seven) days.        Marland Kitchen warfarin (COUMADIN) 5 MG tablet Take 5 mg by mouth as directed.        . citalopram (CELEXA) 10 MG tablet Take 10 mg by mouth daily.          Allergies  Allergen Reactions  . Diltiazem     Patient Active  Problem List  Diagnoses  . Atrial fibrillation  . Pure hypercholesterolemia  . Diabetes mellitus without mention of complication  . Malaise and fatigue  . Hx of CABG    History  Smoking status  . Never Smoker   Smokeless tobacco  . Not on file    History  Alcohol Use No    Family History  Problem Relation Age of Onset  . Prostate cancer Father     Review of Systems: Constitutional: no fever chills diaphoresis or fatigue or change in weight.  Head and neck: no hearing loss, no epistaxis, no photophobia or visual disturbance. Respiratory: No cough, shortness of breath or wheezing. Cardiovascular: No chest pain peripheral edema, palpitations. Gastrointestinal: No abdominal distention, no abdominal pain, no change in bowel habits hematochezia or melena. Genitourinary: No dysuria, no frequency, no urgency, no nocturia. Musculoskeletal:No arthralgias, no back pain, no gait disturbance or myalgias. Neurological: No dizziness, no headaches, no numbness, no seizures, no syncope, no weakness, no tremors. Hematologic:  No lymphadenopathy, no easy bruising. Psychiatric: No confusion, no hallucinations, no sleep disturbance.    Physical Exam: Filed Vitals:   11/10/11 0846  BP: 118/67  Pulse: 72   general appearance reveals a well-developed well-nourished elderly gentleman in no distress.The head and neck exam reveals pupils equal and reactive.  Extraocular movements are full.  There is no scleral icterus.  The mouth and pharynx are normal.  The neck is supple.  The carotids reveal no bruits.  The jugular venous pressure is normal.  The  thyroid is not enlarged.  There is no lymphadenopathy.  The chest is clear to percussion and auscultation.  There are no rales or rhonchi.  Expansion of the chest is symmetrical.  The precordium is quiet.  The first heart sound is normal.  The second heart sound is physiologically split.  There is no gallop rub or click.  The rhythm is irregular.  There  is a grade 2/6 systolic ejection murmur at the base.  There is no abnormal lift or heave.  The abdomen is soft and nontender.  The bowel sounds are normal.  The liver and spleen are not enlarged.  There are no abdominal masses.  There are no abdominal bruits.  Extremities reveal good pedal pulses.  There is no phlebitis or edema.  There is no cyanosis or clubbing.  Strength is normal and symmetrical in all extremities.  There is no lateralizing weakness.  There are no sensory deficits.  The skin is warm and dry.  There is no rash.  EKG shows atrial fibrillation with a controlled ventricular response and no ischemic changes  Assessment / Plan: Continue same medication except stop simvastatin.  Return for a two-dimensional echocardiogram for an ablation of his systolic basilar heart murmur and his atrial fibrillation.  Recheck in 4 months for followup office visit and fasting lab work

## 2011-11-10 NOTE — Telephone Encounter (Signed)
Patient called with lab results. Pt verbalized understanding. Jodette Lavern Crimi RN  

## 2011-11-10 NOTE — Assessment & Plan Note (Signed)
The patient has not been having any hypoglycemic episodes.  He is on metformin.  He is not on a particularly careful diet however.

## 2011-11-10 NOTE — Assessment & Plan Note (Signed)
Patient has been on long-term simvastatin.  He is complaining of some intermittent pain and soreness of his right thigh.  We will leave off his simvastatin and observe whether the discomfort improves

## 2011-11-12 ENCOUNTER — Ambulatory Visit (HOSPITAL_COMMUNITY): Payer: Medicare Other | Attending: Cardiology

## 2011-11-12 DIAGNOSIS — R5381 Other malaise: Secondary | ICD-10-CM | POA: Insufficient documentation

## 2011-11-12 DIAGNOSIS — E119 Type 2 diabetes mellitus without complications: Secondary | ICD-10-CM | POA: Insufficient documentation

## 2011-11-12 DIAGNOSIS — E785 Hyperlipidemia, unspecified: Secondary | ICD-10-CM | POA: Insufficient documentation

## 2011-11-12 DIAGNOSIS — I4891 Unspecified atrial fibrillation: Secondary | ICD-10-CM

## 2011-11-12 DIAGNOSIS — R0609 Other forms of dyspnea: Secondary | ICD-10-CM | POA: Insufficient documentation

## 2011-11-12 DIAGNOSIS — I2589 Other forms of chronic ischemic heart disease: Secondary | ICD-10-CM | POA: Insufficient documentation

## 2011-11-12 DIAGNOSIS — I379 Nonrheumatic pulmonary valve disorder, unspecified: Secondary | ICD-10-CM | POA: Insufficient documentation

## 2011-11-12 DIAGNOSIS — I517 Cardiomegaly: Secondary | ICD-10-CM | POA: Insufficient documentation

## 2011-11-12 DIAGNOSIS — I059 Rheumatic mitral valve disease, unspecified: Secondary | ICD-10-CM | POA: Insufficient documentation

## 2011-11-12 DIAGNOSIS — R0602 Shortness of breath: Secondary | ICD-10-CM

## 2011-11-12 DIAGNOSIS — R0989 Other specified symptoms and signs involving the circulatory and respiratory systems: Secondary | ICD-10-CM | POA: Insufficient documentation

## 2011-11-12 DIAGNOSIS — R011 Cardiac murmur, unspecified: Secondary | ICD-10-CM

## 2011-11-12 NOTE — Progress Notes (Signed)
Echocardiogram performed.  

## 2011-11-17 ENCOUNTER — Telehealth: Payer: Self-pay | Admitting: *Deleted

## 2011-11-17 NOTE — Telephone Encounter (Signed)
Message copied by Burnell Blanks on Tue Nov 17, 2011  3:24 PM ------      Message from: Cassell Clement      Created: Thu Nov 12, 2011  7:32 PM       Please report: the echo shows aortic stenosis is not severe. LV function is good.   CSD

## 2011-11-17 NOTE — Telephone Encounter (Signed)
Advised of echo results 

## 2012-03-21 ENCOUNTER — Encounter: Payer: Self-pay | Admitting: Cardiology

## 2012-03-21 ENCOUNTER — Ambulatory Visit (INDEPENDENT_AMBULATORY_CARE_PROVIDER_SITE_OTHER): Payer: Medicare Other | Admitting: Cardiology

## 2012-03-21 VITALS — BP 118/60 | HR 56 | Ht 76.0 in | Wt 215.3 lb

## 2012-03-21 DIAGNOSIS — Z951 Presence of aortocoronary bypass graft: Secondary | ICD-10-CM

## 2012-03-21 DIAGNOSIS — I25810 Atherosclerosis of coronary artery bypass graft(s) without angina pectoris: Secondary | ICD-10-CM

## 2012-03-21 DIAGNOSIS — IMO0001 Reserved for inherently not codable concepts without codable children: Secondary | ICD-10-CM

## 2012-03-21 DIAGNOSIS — I4891 Unspecified atrial fibrillation: Secondary | ICD-10-CM

## 2012-03-21 NOTE — Progress Notes (Signed)
Rodney Montes Date of Birth:  April 16, 1923 [facility] [address] [place], [place]  [address] [phone]         Fax   [phone]  History of Present Illness: This pleasant 78 year old gentleman is seen for a scheduled four-month followup office visit. The patient has known ischemic heart disease. He had coronary artery bypass graft surgery in 1989. He had recurrent angina in 2007 and had a drug-eluting stent placed to the saphenous vein graft to the right coronary artery. Every 2008 he had a nuclear stress test which was negative. Since then he has had no recurrent angina pectoris and has been doing well.  The patient has a known heart murmur. His last echocardiogram was done on 11/12/11 and showed an ejection fraction of 60% and he has moderate aortic stenosis.  The patient does not tolerate statins.  He developed severe myalgias on statins.   Current Outpatient Prescriptions  Medication Sig Dispense Refill  . Ascorbic Acid (VITAMIN C PO) Take 500 mg by mouth daily.       . citalopram (CELEXA) 10 MG tablet Take 10 mg by mouth daily.        . clopidogrel (PLAVIX) 75 MG tablet Take 75 mg by mouth daily.        Marland Kitchen ezetimibe (ZETIA) 10 MG tablet Take 10 mg by mouth daily.        . furosemide (LASIX) 20 MG tablet Take 20 mg by mouth daily.      . lansoprazole (PREVACID) 30 MG capsule Take 30 mg by mouth as needed.       . meclizine (ANTIVERT) 25 MG tablet Take 25 mg by mouth 3 (three) times daily as needed.        . metFORMIN (GLUMETZA) 500 MG (MOD) 24 hr tablet Take 500 mg by mouth daily with breakfast.        . metoprolol (TOPROL-XL) 50 MG 24 hr tablet Take 50 mg by mouth 2 (two) times daily.        . nitroGLYCERIN (NITROSTAT) 0.4 MG SL tablet Place 0.4 mg under the tongue every 5 (five) minutes as needed.        . Vitamin D, Ergocalciferol, (DRISDOL) 50000 UNITS CAPS Take 50,000 Units by mouth every 7 (seven) days.        Marland Kitchen warfarin (COUMADIN) 5 MG tablet Take 5 mg  by mouth as directed.          Allergies  Allergen Reactions  . Diltiazem   . Simvastatin Other (See Comments)    Leg/ muscle ache    Patient Active Problem List  Diagnosis  . Atrial fibrillation  . Pure hypercholesterolemia  . Diabetes mellitus without mention of complication  . Malaise and fatigue  . Hx of CABG    History  Smoking status  . Never Smoker   Smokeless tobacco  . Not on file    History  Alcohol Use No    Family History  Problem Relation Age of Onset  . Prostate cancer Father     Review of Systems: Constitutional: no fever chills diaphoresis or fatigue or change in weight.  Head and neck: no hearing loss, no epistaxis, no photophobia or visual disturbance. Respiratory: No cough, shortness of breath or wheezing. Cardiovascular: No chest pain peripheral edema, palpitations. Gastrointestinal: No abdominal distention, no abdominal pain, no change in bowel habits hematochezia or melena. Genitourinary: No dysuria, no frequency, no urgency, no nocturia. Musculoskeletal:No arthralgias, no back pain, no gait disturbance  or myalgias. Neurological: No dizziness, no headaches, no numbness, no seizures, no syncope, no weakness, no tremors. Hematologic: No lymphadenopathy, no easy bruising. Psychiatric: No confusion, no hallucinations, no sleep disturbance.    Physical Exam: Filed Vitals:   03/21/12 1617  BP: 118/60  Pulse: 56   the general appearance reveals a tall gentleman in no distress.The head and neck exam reveals pupils equal and reactive.  Extraocular movements are full.  There is no scleral icterus.  The mouth and pharynx are normal.  The neck is supple.  The carotids reveal no bruits.  The jugular venous pressure is normal.  The  thyroid is not enlarged.  There is no lymphadenopathy.  The chest is clear to percussion and auscultation.  There are no rales or rhonchi.  Expansion of the chest is symmetrical.  The precordium is quiet.  The first heart  sound is normal.  The second heart sound is physiologically split.  There is no gallop rub or click.  There is a grade 2/6 systolic ejection murmur at the base. There is no abnormal lift or heave.  The abdomen is soft and nontender.  The bowel sounds are normal.  The liver and spleen are not enlarged.  There are no abdominal masses.  There are no abdominal bruits.  Extremities reveal good pedal pulses.  There is no phlebitis or edema.  There is no cyanosis or clubbing.  Strength is normal and symmetrical in all extremities.  There is no lateralizing weakness.  There are no sensory deficits.  The skin is warm and dry.  There is no rash.     Assessment / Plan: The patient is to continue same medication.  He had stopped walking because of his severe myalgias from the simvastatin.  He feels that he can now resume walking regularly.  He has not played any golf this year. Continue same medication and be rechecked in 6 months for followup office visit EKG CBC lipid panel hepatic function panel and basal metabolic panel.

## 2012-03-21 NOTE — Assessment & Plan Note (Signed)
The patient remains on long-term Coumadin for his atrial fibrillation.  His prothrombin times are checked at the [facility] each month.  The patient has had no TIA symptoms

## 2012-03-21 NOTE — Assessment & Plan Note (Signed)
The patient has not been experiencing any recurrent angina pectoris. 

## 2012-03-21 NOTE — Assessment & Plan Note (Signed)
The patient has not been experiencing any severe hypoglycemic episodes.  He will have occasional mild hypoglycemic episodes late in the afternoon before dinner but no nocturnal episodes

## 2012-03-21 NOTE — Patient Instructions (Addendum)
Your physician recommends that you continue on your current medications as directed. Please refer to the Current Medication list given to you today.  Your physician wants you to follow-up in: 6 months with fasting labs (lp/bmet/hfp/cbc) You will receive a reminder letter in the mail two months in advance. If you don't receive a letter, please call our office to schedule the follow-up appointment.  

## 2012-09-14 ENCOUNTER — Telehealth: Payer: Self-pay | Admitting: *Deleted

## 2012-09-14 NOTE — Telephone Encounter (Signed)
Thanks for looking at it.  Agree with advice given.

## 2012-09-14 NOTE — Telephone Encounter (Signed)
Patient walked in with swelling and pain in his left wrist. Patient wanted  Dr. Patty Sermons to look at it this am.  Dr. Patty Sermons is not in the office today. Wrist was swollen, no discoloration. Patient stated he was not sure if something bit it or he injured it. He did mention he had his wifes funeral this afternoon.  Advised patient to call  his PCP Dr Pete Glatter.

## 2012-09-19 ENCOUNTER — Ambulatory Visit (INDEPENDENT_AMBULATORY_CARE_PROVIDER_SITE_OTHER): Payer: Medicare Other | Admitting: Cardiology

## 2012-09-19 ENCOUNTER — Other Ambulatory Visit (INDEPENDENT_AMBULATORY_CARE_PROVIDER_SITE_OTHER): Payer: Medicare Other

## 2012-09-19 ENCOUNTER — Encounter: Payer: Self-pay | Admitting: Cardiology

## 2012-09-19 VITALS — BP 136/72 | HR 79 | Ht 76.0 in | Wt 202.8 lb

## 2012-09-19 DIAGNOSIS — IMO0001 Reserved for inherently not codable concepts without codable children: Secondary | ICD-10-CM

## 2012-09-19 DIAGNOSIS — I4891 Unspecified atrial fibrillation: Secondary | ICD-10-CM

## 2012-09-19 DIAGNOSIS — Z951 Presence of aortocoronary bypass graft: Secondary | ICD-10-CM

## 2012-09-19 DIAGNOSIS — I25810 Atherosclerosis of coronary artery bypass graft(s) without angina pectoris: Secondary | ICD-10-CM

## 2012-09-19 DIAGNOSIS — E785 Hyperlipidemia, unspecified: Secondary | ICD-10-CM

## 2012-09-19 DIAGNOSIS — I350 Nonrheumatic aortic (valve) stenosis: Secondary | ICD-10-CM | POA: Insufficient documentation

## 2012-09-19 DIAGNOSIS — R0989 Other specified symptoms and signs involving the circulatory and respiratory systems: Secondary | ICD-10-CM

## 2012-09-19 DIAGNOSIS — I359 Nonrheumatic aortic valve disorder, unspecified: Secondary | ICD-10-CM

## 2012-09-19 LAB — HEPATIC FUNCTION PANEL
AST: 20 U/L (ref 0–37)
Albumin: 3.8 g/dL (ref 3.5–5.2)
Alkaline Phosphatase: 76 U/L (ref 39–117)
Bilirubin, Direct: 0.1 mg/dL (ref 0.0–0.3)
Total Protein: 6.9 g/dL (ref 6.0–8.3)

## 2012-09-19 LAB — BASIC METABOLIC PANEL
BUN: 17 mg/dL (ref 6–23)
Calcium: 9.2 mg/dL (ref 8.4–10.5)
Creatinine, Ser: 0.9 mg/dL (ref 0.4–1.5)

## 2012-09-19 LAB — CBC WITH DIFFERENTIAL/PLATELET
Basophils Relative: 0.2 % (ref 0.0–3.0)
Eosinophils Absolute: 0.1 10*3/uL (ref 0.0–0.7)
Eosinophils Relative: 1.7 % (ref 0.0–5.0)
Lymphocytes Relative: 16.9 % (ref 12.0–46.0)
MCHC: 33 g/dL (ref 30.0–36.0)
Monocytes Absolute: 0.8 10*3/uL (ref 0.1–1.0)
Neutrophils Relative %: 70.6 % (ref 43.0–77.0)
Platelets: 226 10*3/uL (ref 150.0–400.0)
RBC: 4.5 Mil/uL (ref 4.22–5.81)
WBC: 8 10*3/uL (ref 4.5–10.5)

## 2012-09-19 LAB — LIPID PANEL
Cholesterol: 179 mg/dL (ref 0–200)
Triglycerides: 109 mg/dL (ref 0.0–149.0)

## 2012-09-19 NOTE — Assessment & Plan Note (Signed)
The patient does not have any history of recurrent chest pain or angina.

## 2012-09-19 NOTE — Patient Instructions (Addendum)
Your physician recommends that you continue on your current medications as directed. Please refer to the Current Medication list given to you today.  Your physician wants you to follow-up in: 6 months. You will receive a reminder letter in the mail two months in advance. If you don't receive a letter, please call our office to schedule the follow-up appointment.  

## 2012-09-19 NOTE — Assessment & Plan Note (Signed)
The patient has not been having any hypoglycemic reactions.

## 2012-09-19 NOTE — Progress Notes (Signed)
Quick Note:  Please report to patient. The recent labs are stable. Continue same medication and careful diet. LDL slightly high. Continue Zetia and careful diet. ______

## 2012-09-19 NOTE — Assessment & Plan Note (Signed)
The patient has not had a lot of energy.  He attributes some of this to the recent stress of caring for his wife over the past several months prior to her death last week.  Since we last saw him 6 months ago his weight is down 13 pounds.  He states that his appetite is still okay.  He lives in a cottage at the [place] home.

## 2012-09-19 NOTE — Progress Notes (Signed)
Rodney Montes Date of Birth:  25-Dec-1922 [facility] [address] [place], [place]  [address] [phone]         Fax   [phone]  History of Present Illness: This pleasant 79 year old gentleman is seen for a scheduled four-month followup office visit.  He is not doing as well.  His wife died of Alzheimer's just last week. The patient has known ischemic heart disease. He had coronary artery bypass graft surgery in 1989. He had recurrent angina in 2007 and had a drug-eluting stent placed to the saphenous vein graft to the right coronary artery. Every 2008 he had a nuclear stress test which was negative. Since then he has had no recurrent angina pectoris and has been doing well. The patient has a known heart murmur. His last echocardiogram was done on 11/12/11 and showed an ejection fraction of 60% and he has moderate aortic stenosis. The patient does not tolerate statins. He developed severe myalgias on statins.   Current Outpatient Prescriptions  Medication Sig Dispense Refill  . Ascorbic Acid (VITAMIN C PO) Take 500 mg by mouth daily.       . citalopram (CELEXA) 10 MG tablet Take 10 mg by mouth daily.        . clopidogrel (PLAVIX) 75 MG tablet Take 75 mg by mouth daily.        Marland Kitchen ezetimibe (ZETIA) 10 MG tablet Take 10 mg by mouth daily.        . furosemide (LASIX) 20 MG tablet Take 20 mg by mouth daily.      . lansoprazole (PREVACID) 30 MG capsule Take 30 mg by mouth as needed.       . meclizine (ANTIVERT) 25 MG tablet Take 25 mg by mouth 3 (three) times daily as needed.        . metFORMIN (GLUMETZA) 500 MG (MOD) 24 hr tablet Take 500 mg by mouth daily with breakfast.        . metoprolol (TOPROL-XL) 50 MG 24 hr tablet Take 50 mg by mouth 2 (two) times daily.        . nitroGLYCERIN (NITROSTAT) 0.4 MG SL tablet Place 0.4 mg under the tongue every 5 (five) minutes as needed.        . Vitamin D, Ergocalciferol, (DRISDOL) 50000 UNITS CAPS Take 50,000 Units by mouth every  7 (seven) days.        Marland Kitchen warfarin (COUMADIN) 5 MG tablet Take 5 mg by mouth as directed.         No current facility-administered medications for this visit.    Allergies  Allergen Reactions  . Diltiazem   . Simvastatin Other (See Comments)    Leg/ muscle ache    Patient Active Problem List  Diagnosis  . Atrial fibrillation  . Pure hypercholesterolemia  . Diabetes mellitus without mention of complication  . Malaise and fatigue  . Hx of CABG    History  Smoking status  . Never Smoker   Smokeless tobacco  . Not on file    History  Alcohol Use No    Family History  Problem Relation Age of Onset  . Prostate cancer Father     Review of Systems: Constitutional: no fever chills diaphoresis or fatigue or change in weight.  Head and neck: no hearing loss, no epistaxis, no photophobia or visual disturbance. Respiratory: No cough, shortness of breath or wheezing. Cardiovascular: No chest pain peripheral edema, palpitations. Gastrointestinal: No abdominal distention, no abdominal pain, no change in  bowel habits hematochezia or melena. Genitourinary: No dysuria, no frequency, no urgency, no nocturia. Musculoskeletal:No arthralgias, no back pain, no gait disturbance or myalgias. Neurological: No dizziness, no headaches, no numbness, no seizures, no syncope, no weakness, no tremors. Hematologic: No lymphadenopathy, no easy bruising. Psychiatric: No confusion, no hallucinations, no sleep disturbance.    Physical Exam: Filed Vitals:   09/19/12 1024  BP: 136/72  Pulse: 79   the general appearance reveals an elderly gentleman who appears tired.The head and neck exam reveals pupils equal and reactive.  Extraocular movements are full.  There is no scleral icterus.  The mouth and pharynx are normal.  The neck is supple.  The carotids reveal no bruits.  The jugular venous pressure is normal.  The  thyroid is not enlarged.  There is no lymphadenopathy.  The chest is clear to  percussion and auscultation.  There are no rales or rhonchi.  Expansion of the chest is symmetrical.  The precordium is quiet.  The pulse is irregularly irregular The first heart sound is normal.  The second heart sound is physiologically split.  There is no  gallop rub or click.  There is a soft systolic murmur of aortic stenosis.  There is no abnormal lift or heave.  The abdomen is soft and nontender.  The bowel sounds are normal.  The liver and spleen are not enlarged.  There are no abdominal masses.  There are no abdominal bruits.  Extremities reveal good pedal pulses.  There is no phlebitis or edema.  There is no cyanosis or clubbing.  Strength is normal and symmetrical in all extremities.  There is no lateralizing weakness.  There are no sensory deficits.  The skin is warm and dry.  There is no rash.     Assessment / Plan: Continue same medication.  Recheck in 6 months for office visit EKG CBC A1c lipid panel hepatic function panel and basal metabolic panel.

## 2012-09-19 NOTE — Assessment & Plan Note (Signed)
The patient has a history of chronic permanent atrial fibrillation.  He is on long-term Coumadin which is followed at the [facility].  He has not been having any TIA symptoms.

## 2012-09-19 NOTE — Assessment & Plan Note (Signed)
The patient is not having any symptoms related to his moderate aortic stenosis.

## 2012-09-20 ENCOUNTER — Telehealth: Payer: Self-pay | Admitting: *Deleted

## 2012-09-20 NOTE — Telephone Encounter (Signed)
Patient unavailable when called, left message would highlight  Dr. Yevonne Pax comments and mail to patient. Call if any questions

## 2012-09-20 NOTE — Telephone Encounter (Signed)
Message copied by Burnell Blanks on Tue Sep 20, 2012  9:45 AM ------      Message from: Cassell Clement      Created: Mon Sep 19, 2012 10:13 PM       Please report to patient.  The recent labs are stable. Continue same medication and careful diet. LDL slightly high.  Continue Zetia and careful diet. ------

## 2013-02-20 ENCOUNTER — Telehealth: Payer: Self-pay | Admitting: Cardiology

## 2013-02-20 NOTE — Telephone Encounter (Signed)
New Problem  Pt states he needs a written referral for a hearing test//

## 2013-02-20 NOTE — Telephone Encounter (Signed)
Will fax signed order to [facility] as requested

## 2013-03-23 ENCOUNTER — Encounter: Payer: Self-pay | Admitting: Cardiology

## 2013-03-23 ENCOUNTER — Ambulatory Visit (INDEPENDENT_AMBULATORY_CARE_PROVIDER_SITE_OTHER): Payer: Medicare Other | Admitting: Cardiology

## 2013-03-23 ENCOUNTER — Encounter (INDEPENDENT_AMBULATORY_CARE_PROVIDER_SITE_OTHER): Payer: Self-pay

## 2013-03-23 VITALS — BP 148/93 | HR 76 | Ht 76.0 in | Wt 207.0 lb

## 2013-03-23 DIAGNOSIS — I350 Nonrheumatic aortic (valve) stenosis: Secondary | ICD-10-CM

## 2013-03-23 DIAGNOSIS — I25810 Atherosclerosis of coronary artery bypass graft(s) without angina pectoris: Secondary | ICD-10-CM

## 2013-03-23 DIAGNOSIS — I359 Nonrheumatic aortic valve disorder, unspecified: Secondary | ICD-10-CM

## 2013-03-23 DIAGNOSIS — I4891 Unspecified atrial fibrillation: Secondary | ICD-10-CM

## 2013-03-23 DIAGNOSIS — IMO0001 Reserved for inherently not codable concepts without codable children: Secondary | ICD-10-CM

## 2013-03-23 NOTE — Assessment & Plan Note (Signed)
The patient has not been experiencing any symptoms of congestive heart failure.  No angina pectoris.

## 2013-03-23 NOTE — Progress Notes (Signed)
Rodney Montes Date of Birth:  06-May-1923 [address] [facility] [address] [place], [place]  [address] [phone]         Fax   [phone]  History of Present Illness: This pleasant 79 year old gentleman is seen for a scheduled four-month followup office visit.  He is a recent widower.  He lives by himself in his patio home at [place] [facility].  The patient has known ischemic heart disease. He had coronary artery bypass graft surgery in 1989. He had recurrent angina in 2007 and had a drug-eluting stent placed to the saphenous vein graft to the right coronary artery. Every 2008 he had a nuclear stress test which was negative. Since then he has had no recurrent angina pectoris and has been doing well. The patient has a known heart murmur. His last echocardiogram was done on 11/12/11 and showed an ejection fraction of 60% and he has moderate aortic stenosis. The patient does not tolerate statins. He developed severe myalgias on statins.   Current Outpatient Prescriptions  Medication Sig Dispense Refill  . Ascorbic Acid (VITAMIN C PO) Take 500 mg by mouth daily.       . citalopram (CELEXA) 10 MG tablet Take 10 mg by mouth daily.        . clopidogrel (PLAVIX) 75 MG tablet Take 75 mg by mouth daily.        Marland Kitchen ezetimibe (ZETIA) 10 MG tablet Take 10 mg by mouth daily.        . furosemide (LASIX) 20 MG tablet Take 20 mg by mouth daily.      . lansoprazole (PREVACID) 30 MG capsule Take 30 mg by mouth as needed.       . meclizine (ANTIVERT) 25 MG tablet Take 25 mg by mouth 3 (three) times daily as needed.        . metFORMIN (GLUMETZA) 500 MG (MOD) 24 hr tablet Take 500 mg by mouth daily with breakfast.        . metoprolol (TOPROL-XL) 50 MG 24 hr tablet Take 50 mg by mouth 2 (two) times daily.        . nitroGLYCERIN (NITROSTAT) 0.4 MG SL tablet Place 0.4 mg under the tongue every 5 (five) minutes as needed.        . Vitamin D, Ergocalciferol, (DRISDOL) 50000 UNITS CAPS Take 50,000 Units by mouth every 7  (seven) days.        Marland Kitchen warfarin (COUMADIN) 5 MG tablet Take 5 mg by mouth as directed.         No current facility-administered medications for this visit.    Allergies  Allergen Reactions  . Diltiazem   . Simvastatin Other (See Comments)    Leg/ muscle ache    Patient Active Problem List   Diagnosis Date Noted  . Aortic stenosis, moderate 09/19/2012  . Hx of CABG 11/10/2011  . Atrial fibrillation 03/12/2011  . Pure hypercholesterolemia 03/12/2011  . Diabetes mellitus without mention of complication 03/12/2011  . Malaise and fatigue 03/12/2011    History  Smoking status  . Never Smoker   Smokeless tobacco  . Not on file    History  Alcohol Use No    Family History  Problem Relation Age of Onset  . Prostate cancer Father     Review of Systems: Constitutional: no fever chills diaphoresis or fatigue or change in weight.  Head and neck: no hearing loss, no epistaxis, no photophobia or visual disturbance. Respiratory: No cough, shortness of breath or wheezing. Cardiovascular:  No chest pain peripheral edema, palpitations. Gastrointestinal: No abdominal distention, no abdominal pain, no change in bowel habits hematochezia or melena. Genitourinary: No dysuria, no frequency, no urgency, no nocturia. Musculoskeletal:No arthralgias, no back pain, no gait disturbance or myalgias. Neurological: No dizziness, no headaches, no numbness, no seizures, no syncope, no weakness, no tremors. Hematologic: No lymphadenopathy, no easy bruising. Psychiatric: No confusion, no hallucinations, no sleep disturbance.    Physical Exam: Filed Vitals:   03/23/13 1057  BP: 148/93  Pulse: 76   the general appearance reveals an elderly gentleman who appears tired.The head and neck exam reveals pupils equal and reactive.  Extraocular movements are full.  There is no scleral icterus.  The mouth and pharynx are normal.  The neck is supple.  The carotids reveal no bruits.  The jugular venous  pressure is normal.  The  thyroid is not enlarged.  There is no lymphadenopathy.  The chest is clear to percussion and auscultation.  There are no rales or rhonchi.  Expansion of the chest is symmetrical.  The precordium is quiet.  The pulse is irregularly irregular The first heart sound is normal.  The second heart sound is physiologically split.  There is no  gallop rub or click.  There is a soft systolic murmur of aortic stenosis.  There is no abnormal lift or heave.  The abdomen is soft and nontender.  The bowel sounds are normal.  The liver and spleen are not enlarged.  There are no abdominal masses.  There are no abdominal bruits.  Extremities reveal good pedal pulses.  There is no phlebitis or edema.  There is no cyanosis or clubbing.  Strength is normal and symmetrical in all extremities.  There is no lateralizing weakness.  There are no sensory deficits.  The skin is warm and dry.  There is no rash.  EKG today shows atrial fibrillation with controlled ventricular response.  Rate 76.  No ischemic changes.   Assessment / Plan: Continue same medication.  Recheck in 6 months for office visit  CBC  lipid panel hepatic function panel and basal metabolic panel.

## 2013-03-23 NOTE — Patient Instructions (Signed)
Your physician recommends that you continue on your current medications as directed. Please refer to the Current Medication list given to you today.  Your physician wants you to follow-up in: 6 months with fasting labs (lp/bmet/hfp/cbc) You will receive a reminder letter in the mail two months in advance. If you don't receive a letter, please call our office to schedule the follow-up appointment.  

## 2013-03-23 NOTE — Assessment & Plan Note (Signed)
The patient is in permanent atrial fibrillation.  He gets a monthly protime at the [facility].  He states that his INR has been remaining between 2.0 and 3.0.  He has not been experiencing any TIA symptoms.

## 2013-03-23 NOTE — Assessment & Plan Note (Signed)
The patient has not been experiencing any hypoglycemic episodes. 

## 2013-11-15 ENCOUNTER — Encounter: Payer: Self-pay | Admitting: Cardiology

## 2013-11-15 ENCOUNTER — Other Ambulatory Visit (INDEPENDENT_AMBULATORY_CARE_PROVIDER_SITE_OTHER): Payer: Medicare Other

## 2013-11-15 ENCOUNTER — Ambulatory Visit (INDEPENDENT_AMBULATORY_CARE_PROVIDER_SITE_OTHER): Payer: Medicare Other | Admitting: Cardiology

## 2013-11-15 VITALS — BP 146/82 | HR 119 | Ht 76.0 in | Wt 203.0 lb

## 2013-11-15 DIAGNOSIS — IMO0001 Reserved for inherently not codable concepts without codable children: Secondary | ICD-10-CM

## 2013-11-15 DIAGNOSIS — I25810 Atherosclerosis of coronary artery bypass graft(s) without angina pectoris: Secondary | ICD-10-CM

## 2013-11-15 DIAGNOSIS — E1165 Type 2 diabetes mellitus with hyperglycemia: Secondary | ICD-10-CM

## 2013-11-15 DIAGNOSIS — I4891 Unspecified atrial fibrillation: Secondary | ICD-10-CM

## 2013-11-15 DIAGNOSIS — Z79899 Other long term (current) drug therapy: Secondary | ICD-10-CM

## 2013-11-15 DIAGNOSIS — I359 Nonrheumatic aortic valve disorder, unspecified: Secondary | ICD-10-CM

## 2013-11-15 DIAGNOSIS — I350 Nonrheumatic aortic (valve) stenosis: Secondary | ICD-10-CM

## 2013-11-15 DIAGNOSIS — E7800 Pure hypercholesterolemia, unspecified: Secondary | ICD-10-CM

## 2013-11-15 LAB — CBC WITH DIFFERENTIAL/PLATELET
BASOS ABS: 0 10*3/uL (ref 0.0–0.1)
Basophils Relative: 0.6 % (ref 0.0–3.0)
EOS ABS: 0.3 10*3/uL (ref 0.0–0.7)
Eosinophils Relative: 3.7 % (ref 0.0–5.0)
HEMATOCRIT: 45.8 % (ref 39.0–52.0)
HEMOGLOBIN: 15.2 g/dL (ref 13.0–17.0)
LYMPHS ABS: 2.4 10*3/uL (ref 0.7–4.0)
Lymphocytes Relative: 28.9 % (ref 12.0–46.0)
MCHC: 33.1 g/dL (ref 30.0–36.0)
MCV: 94.4 fl (ref 78.0–100.0)
Monocytes Absolute: 0.9 10*3/uL (ref 0.1–1.0)
Monocytes Relative: 11.2 % (ref 3.0–12.0)
NEUTROS ABS: 4.5 10*3/uL (ref 1.4–7.7)
Neutrophils Relative %: 55.6 % (ref 43.0–77.0)
Platelets: 205 10*3/uL (ref 150.0–400.0)
RBC: 4.86 Mil/uL (ref 4.22–5.81)
RDW: 14.9 % (ref 11.5–15.5)
WBC: 8.1 10*3/uL (ref 4.0–10.5)

## 2013-11-15 LAB — HEPATIC FUNCTION PANEL
ALT: 23 U/L (ref 0–53)
AST: 29 U/L (ref 0–37)
Albumin: 4.1 g/dL (ref 3.5–5.2)
Alkaline Phosphatase: 88 U/L (ref 39–117)
BILIRUBIN TOTAL: 1.2 mg/dL (ref 0.2–1.2)
Bilirubin, Direct: 0.1 mg/dL (ref 0.0–0.3)
Total Protein: 7.3 g/dL (ref 6.0–8.3)

## 2013-11-15 LAB — BASIC METABOLIC PANEL
BUN: 16 mg/dL (ref 6–23)
CO2: 29 meq/L (ref 19–32)
Calcium: 9.5 mg/dL (ref 8.4–10.5)
Chloride: 103 mEq/L (ref 96–112)
Creatinine, Ser: 0.9 mg/dL (ref 0.4–1.5)
GFR: 85.57 mL/min (ref >60.00)
GLUCOSE: 112 mg/dL — AB (ref 70–99)
POTASSIUM: 4 meq/L (ref 3.5–5.1)
SODIUM: 140 meq/L (ref 135–145)

## 2013-11-15 LAB — LIPID PANEL
Cholesterol: 191 mg/dL (ref 0–200)
HDL: 47.2 mg/dL (ref >39.00)
LDL CALC: 131 mg/dL — AB (ref 0–99)
NONHDL: 143.8
Total CHOL/HDL Ratio: 4
Triglycerides: 66 mg/dL (ref 0.0–149.0)
VLDL: 13.2 mg/dL (ref 0.0–40.0)

## 2013-11-15 NOTE — Assessment & Plan Note (Signed)
Patient has hypercholesterolemia and coronary disease.  He is intolerant of statins.  He is on ezetimibe.  We will plan to recheck his fasting labs at his next visit.

## 2013-11-15 NOTE — Assessment & Plan Note (Signed)
Patient is in atrial fibrillation.  He has not had any embolic events.  He gets his protime is checked at the [facility] every month

## 2013-11-15 NOTE — Assessment & Plan Note (Signed)
The patient is not having any symptoms from his aortic stenosis 

## 2013-11-15 NOTE — Patient Instructions (Signed)
Your physician recommends that you continue on your current medications as directed. Please refer to the Current Medication list given to you today.  Your physician wants you to follow-up in: 6 months with fasting labs (lp/bmet/hfp/a1c/cbc) and ekg You will receive a reminder letter in the mail two months in advance. If you don't receive a letter, please call our office to schedule the follow-up appointment.

## 2013-11-15 NOTE — Progress Notes (Signed)
Rodney Montes Date of Birth:  1922-09-30 [facility] [address] [place], [place]  [address] [phone]        Fax   [phone]   History of Present Illness: This pleasant 80 year old gentleman is seen for a scheduled four-month followup office visit. He is a recent widower. He lives by himself in his patio home at [place] [facility]. The patient has known ischemic heart disease. He had coronary artery bypass graft surgery in 1989. He had recurrent angina in 2007 and had a drug-eluting stent placed to the saphenous vein graft to the right coronary artery. Every 2008 he had a nuclear stress test which was negative. Since then he has had no recurrent angina pectoris and has been doing well. The patient has a known heart murmur. His last echocardiogram was done on 11/12/11 and showed an ejection fraction of 60% and he has moderate aortic stenosis. The patient does not tolerate statins. He developed severe myalgias on statins.   Current Outpatient Prescriptions  Medication Sig Dispense Refill  . Ascorbic Acid (VITAMIN C PO) Take 500 mg by mouth daily.       . citalopram (CELEXA) 10 MG tablet Take 10 mg by mouth daily.        . clopidogrel (PLAVIX) 75 MG tablet Take 75 mg by mouth daily.        Marland Kitchen ezetimibe (ZETIA) 10 MG tablet Take 10 mg by mouth daily.        . furosemide (LASIX) 20 MG tablet Take 20 mg by mouth daily.      . lansoprazole (PREVACID) 30 MG capsule Take 30 mg by mouth as needed.       . meclizine (ANTIVERT) 25 MG tablet Take 25 mg by mouth 3 (three) times daily as needed.        . metFORMIN (GLUMETZA) 500 MG (MOD) 24 hr tablet Take 500 mg by mouth daily with breakfast.        . metoprolol (TOPROL-XL) 50 MG 24 hr tablet Take 50 mg by mouth 2 (two) times daily.        . nitroGLYCERIN (NITROSTAT) 0.4 MG SL tablet Place 0.4 mg under the tongue every 5 (five) minutes as needed.        . Vitamin D, Ergocalciferol, (DRISDOL) 50000 UNITS CAPS Take 50,000 Units by  mouth every 7 (seven) days.        Marland Kitchen warfarin (COUMADIN) 5 MG tablet Take 5 mg by mouth as directed.         No current facility-administered medications for this visit.    Allergies  Allergen Reactions  . Diltiazem   . Simvastatin Other (See Comments)    Leg/ muscle ache    Patient Active Problem List   Diagnosis Date Noted  . Aortic stenosis, moderate 09/19/2012  . Hx of CABG 11/10/2011  . Atrial fibrillation 03/12/2011  . Pure hypercholesterolemia 03/12/2011  . Type II or unspecified type diabetes mellitus without mention of complication, uncontrolled 03/12/2011  . Malaise and fatigue 03/12/2011    History  Smoking status  . Never Smoker   Smokeless tobacco  . Not on file    History  Alcohol Use No    Family History  Problem Relation Age of Onset  . Prostate cancer Father     Review of Systems: Constitutional: no fever chills diaphoresis or fatigue or change in weight.  Head and neck: no hearing loss, no epistaxis, no photophobia or visual disturbance. Respiratory: No  cough, shortness of breath or wheezing. Cardiovascular: No chest pain peripheral edema, palpitations. Gastrointestinal: No abdominal distention, no abdominal pain, no change in bowel habits hematochezia or melena. Genitourinary: No dysuria, no frequency, no urgency, no nocturia. Musculoskeletal:No arthralgias, no back pain, no gait disturbance or myalgias. Neurological: No dizziness, no headaches, no numbness, no seizures, no syncope, no weakness, no tremors. Hematologic: No lymphadenopathy, no easy bruising. Psychiatric: No confusion, no hallucinations, no sleep disturbance.    Physical Exam: Filed Vitals:   11/15/13 0846  BP: 146/82  Pulse: 119   the general appearance reveals an elderly gentleman in no acute distress.The head and neck exam reveals pupils equal and reactive.  Extraocular movements are full.  There is no scleral icterus.  The mouth and pharynx are normal.  The neck is  supple.  The carotids reveal no bruits.  The jugular venous pressure is normal.  The  thyroid is not enlarged.  There is no lymphadenopathy.  The chest is clear to percussion and auscultation.  There are no rales or rhonchi.  Expansion of the chest is symmetrical.  The precordium is quiet.  The first heart sound is normal.  The second heart sound is physiologically split.  There is a grade 2/6 systolic murmur of aortic stenosis at the base.  No diastolic murmur. There is no abnormal lift or heave.  The abdomen is soft and nontender.  The bowel sounds are normal.  The liver and spleen are not enlarged.  There are no abdominal masses.  There are no abdominal bruits.  Extremities reveal good pedal pulses.  There is no phlebitis or edema.  There is no cyanosis or clubbing.  Strength is normal and symmetrical in all extremities.  There is no lateralizing weakness.  There are no sensory deficits.  The skin is warm and dry.  There is no rash.     Assessment / Plan: 1.  Ischemic heart disease status post CABG in 1989 and status post drug-eluting stent to his saphenous vein graft to the right coronary artery in 2007.  Negative nuclear stress test in 2008 2. permanent atrial fibrillation 3. Hypercholesterolemia 4. type 2 diabetes mellitus 5. moderate aortic stenosis  Plan: Continue on same medication.  Recheck in 6 months for office visit EKG lipid panel hepatic function panel basal metabolic panel CBC and A1c

## 2013-11-16 NOTE — Progress Notes (Signed)
Quick Note:  Please report to patient. The recent labs are stable. Continue same medication and careful diet. There is no anemia. The LDL cholesterol is slightly higher. Watch diet carefully. ______

## 2013-11-20 ENCOUNTER — Telehealth: Payer: Self-pay | Admitting: Cardiology

## 2013-11-20 NOTE — Telephone Encounter (Signed)
Son is aware of lab results He will continue to encourage his father to drink fluids. States pt appetite had decreased & exercise decreased more so since his wife passed away last year after 65 yrs of marriage. He has had no dizziness spells or syncopal episode since he was on the golf [place] with his son prior to seeing Dr. Patty SermonsBrackbill last week  Mylo Redebbie Zarin Knupp RN

## 2013-11-20 NOTE — Telephone Encounter (Signed)
New message ° ° ° ° °Want lab results °

## 2014-03-12 ENCOUNTER — Ambulatory Visit (INDEPENDENT_AMBULATORY_CARE_PROVIDER_SITE_OTHER): Payer: Medicare Other | Admitting: Podiatry

## 2014-03-12 ENCOUNTER — Encounter: Payer: Self-pay | Admitting: Podiatry

## 2014-03-12 VITALS — BP 148/54 | HR 63 | Resp 14 | Ht 76.0 in | Wt 190.0 lb

## 2014-03-12 DIAGNOSIS — B351 Tinea unguium: Secondary | ICD-10-CM

## 2014-03-12 DIAGNOSIS — M79676 Pain in unspecified toe(s): Secondary | ICD-10-CM

## 2014-03-12 DIAGNOSIS — I25810 Atherosclerosis of coronary artery bypass graft(s) without angina pectoris: Secondary | ICD-10-CM

## 2014-03-12 DIAGNOSIS — E1140 Type 2 diabetes mellitus with diabetic neuropathy, unspecified: Secondary | ICD-10-CM

## 2014-03-12 NOTE — Progress Notes (Signed)
   Subjective:    Patient ID: Rodney Montes, male    DOB: 25-Apr-1923, 80 y.o.   MRN: [id number]  HPI Comments: N debridement L 10 toenails D and O long-term C elongated, thick toenails A diabetes, and difficult to cut T Dr. Pete GlatterStoneking referred to get something done about his runaway toenails.  There is no history of skin ulceration or claudication Patient is attempted to trim the toenails in the past week causing some local trauma to his left toes  Review of Systems  All other systems reviewed and are negative.      Objective:   Physical Exam  Orientated x3  Vascular: PTs 2/4 bilaterally DP 0/4 bilaterally  Neurological: Sensation to 10 g monofilament wire intact 42/5 bilaterally Vibratory sensation nonreactive bilaterally Ankle reflexes equal and reactive bilaterally  Dermatological: Multiple punctate eschars noted on the 2-5 toes on the left foot The eschar the second left toe has a mildly inflammatory base without reactive drainage, warmth or malodor The toenails it and partially trimmed by patient in our elongated, discolored, incurvated 6-10  Musculoskeletal: Hammertoe deformities 2-5 bilaterally No restriction ankle, subtalar midtarsal joints bilaterally      Assessment & Plan:   Assessment: Diabetic peripheral neuropathy Patient induced trauma to lateral toes and left foot an attempt to trim toenails Onychomycoses with symptoms 6-10  Plan: Debridement toenails x10 without a bleeding Patient advised not to trim toenails himself any further Advised patient apply triple antibiotic ointment to the second left toe daily and cover with a Band-Aid until a scab forms General information about diabetic foot care provided to patient today

## 2014-03-12 NOTE — Patient Instructions (Signed)
Apply triple antibiotic ointment to the second left toe and cover with a Band-Aid daily until a scab forms  Diabetes and Foot Care Diabetes may cause you to have problems because of poor blood supply (circulation) to your feet and legs. This may cause the skin on your feet to become thinner, break easier, and heal more slowly. Your skin may become dry, and the skin may peel and crack. You may also have nerve damage in your legs and feet causing decreased feeling in them. You may not notice minor injuries to your feet that could lead to infections or more serious problems. Taking care of your feet is one of the most important things you can do for yourself.  HOME CARE INSTRUCTIONS  Wear shoes at all times, even in the house. Do not go barefoot. Bare feet are easily injured.  Check your feet daily for blisters, cuts, and redness. If you cannot see the bottom of your feet, use a mirror or ask someone for help.  Wash your feet with warm water (do not use hot water) and mild soap. Then pat your feet and the areas between your toes until they are completely dry. Do not soak your feet as this can dry your skin.  Apply a moisturizing lotion or petroleum jelly (that does not contain alcohol and is unscented) to the skin on your feet and to dry, brittle toenails. Do not apply lotion between your toes.  Trim your toenails straight across. Do not dig under them or around the cuticle. File the edges of your nails with an emery board or nail file.  Do not cut corns or calluses or try to remove them with medicine.  Wear clean socks or stockings every day. Make sure they are not too tight. Do not wear knee-high stockings since they may decrease blood flow to your legs.  Wear shoes that fit properly and have enough cushioning. To break in new shoes, wear them for just a few hours a day. This prevents you from injuring your feet. Always look in your shoes before you put them on to be sure there are no objects  inside.  Do not cross your legs. This may decrease the blood flow to your feet.  If you find a minor scrape, cut, or break in the skin on your feet, keep it and the skin around it clean and dry. These areas may be cleansed with mild soap and water. Do not cleanse the area with peroxide, alcohol, or iodine.  When you remove an adhesive bandage, be sure not to damage the skin around it.  If you have a wound, look at it several times a day to make sure it is healing.  Do not use heating pads or hot water bottles. They may burn your skin. If you have lost feeling in your feet or legs, you may not know it is happening until it is too late.  Make sure your health care provider performs a complete foot exam at least annually or more often if you have foot problems. Report any cuts, sores, or bruises to your health care provider immediately. SEEK MEDICAL CARE IF:   You have an injury that is not healing.  You have cuts or breaks in the skin.  You have an ingrown nail.  You notice redness on your legs or feet.  You feel burning or tingling in your legs or feet.  You have pain or cramps in your legs and feet.  Your legs or  feet are numb.  Your feet always feel cold. SEEK IMMEDIATE MEDICAL CARE IF:   There is increasing redness, swelling, or pain in or around a wound.  There is a red line that goes up your leg.  Pus is coming from a wound.  You develop a fever or as directed by your health care provider.  You notice a bad smell coming from an ulcer or wound. Document Released: 05/22/2000 Document Revised: 01/25/2013 Document Reviewed: 11/01/2012 [facility] Patient Information 2015 [place], [place]. This information is not intended to replace advice given to you by your health care provider. Make sure you discuss any questions you have with your health care provider.

## 2014-03-13 ENCOUNTER — Encounter: Payer: Self-pay | Admitting: Podiatry

## 2014-05-10 ENCOUNTER — Ambulatory Visit (INDEPENDENT_AMBULATORY_CARE_PROVIDER_SITE_OTHER): Payer: Medicare Other | Admitting: Cardiology

## 2014-05-10 VITALS — BP 104/62 | HR 77 | Ht 76.0 in | Wt 197.0 lb

## 2014-05-10 DIAGNOSIS — E7800 Pure hypercholesterolemia, unspecified: Secondary | ICD-10-CM

## 2014-05-10 DIAGNOSIS — I350 Nonrheumatic aortic (valve) stenosis: Secondary | ICD-10-CM

## 2014-05-10 DIAGNOSIS — I482 Chronic atrial fibrillation, unspecified: Secondary | ICD-10-CM

## 2014-05-10 DIAGNOSIS — I25810 Atherosclerosis of coronary artery bypass graft(s) without angina pectoris: Secondary | ICD-10-CM

## 2014-05-10 DIAGNOSIS — I25708 Atherosclerosis of coronary artery bypass graft(s), unspecified, with other forms of angina pectoris: Secondary | ICD-10-CM

## 2014-05-10 NOTE — Patient Instructions (Signed)
Your physician recommends that you continue on your current medications as directed. Please refer to the Current Medication list given to you today.  Your physician wants you to follow-up in: 6 month ov You will receive a reminder letter in the mail two months in advance. If you don't receive a letter, please call our office to schedule the follow-up appointment.  

## 2014-05-10 NOTE — Assessment & Plan Note (Signed)
Lipids are followed by his PCP

## 2014-05-10 NOTE — Assessment & Plan Note (Signed)
The patient states that he is tired of taking medication.  I explained to him the importance of the Coumadin to prevent embolic stroke.  I mentioned to him possible alternative anticoagulant medication which would not require frequent blood checks but it would be more expensive.  He will think about that.

## 2014-05-10 NOTE — Assessment & Plan Note (Signed)
The patient is not having any symptoms from his aortic stenosis.  No exertional dizziness or syncope.  No angina or symptoms of CHF.

## 2014-05-10 NOTE — Progress Notes (Signed)
Rodney RakersWayne Montes Date of Birth:  Feb 20, 1923 [facility] [address] [place], [place]  [id number] [phone]        Fax   [phone]   History of Present Illness: This pleasant 80 year old gentleman is seen for a scheduled four-month followup office visit. He is a recent widower. He lives by himself in his patio home [facility]. The patient has known ischemic heart disease. He had coronary artery bypass graft surgery in 1989. He had recurrent angina in 2007 and had a drug-eluting stent placed to the saphenous vein graft to the right coronary artery. Every 2008 he had a nuclear stress test which was negative. Since then he has had no recurrent angina pectoris and has been doing well. The patient has a known heart murmur. His last echocardiogram was done on 11/12/11 and showed an ejection fraction of 60% and he has moderate aortic stenosis. The patient does not tolerate statins. He developed severe myalgias on statins.  His primary care physician Dr. Pete GlatterStoneking follows his Coumadin and his lipids.   Current Outpatient Prescriptions  Medication Sig Dispense Refill  . Ascorbic Acid (VITAMIN C PO) Take 500 mg by mouth daily.     Marland Kitchen. aspirin 81 MG tablet Take 81 mg by mouth daily.    . citalopram (CELEXA) 10 MG tablet Take 10 mg by mouth daily.      . clopidogrel (PLAVIX) 75 MG tablet Take 75 mg by mouth daily.      Marland Kitchen. ezetimibe (ZETIA) 10 MG tablet Take 10 mg by mouth daily.      . furosemide (LASIX) 20 MG tablet Take 20 mg by mouth daily.    . lansoprazole (PREVACID) 30 MG capsule Take 30 mg by mouth as needed.     . meclizine (ANTIVERT) 25 MG tablet Take 25 mg by mouth 3 (three) times daily as needed.      . metFORMIN (GLUMETZA) 500 MG (MOD) 24 hr tablet Take 500 mg by mouth daily with breakfast.      . metoprolol (TOPROL-XL) 50 MG 24 hr tablet Take 50 mg by mouth 2 (two) times daily.      . nitroGLYCERIN (NITROSTAT) 0.4 MG SL tablet Place 0.4 mg under the tongue every  5 (five) minutes as needed.      . simvastatin (ZOCOR) 40 MG tablet Take 40 mg by mouth daily.    . Vitamin D, Ergocalciferol, (DRISDOL) 50000 UNITS CAPS Take 50,000 Units by mouth every 7 (seven) days.      Marland Kitchen. warfarin (COUMADIN) 5 MG tablet Take 5 mg by mouth as directed.       No current facility-administered medications for this visit.    Allergies  Allergen Reactions  . Diltiazem   . Simvastatin Other (See Comments)    Leg/ muscle ache    Patient Active Problem List   Diagnosis Date Noted  . Aortic stenosis, moderate 09/19/2012  . Hx of CABG 11/10/2011  . Atrial fibrillation 03/12/2011  . Pure hypercholesterolemia 03/12/2011  . Type II or unspecified type diabetes mellitus without mention of complication, uncontrolled 03/12/2011  . Malaise and fatigue 03/12/2011    History  Smoking status  . Never Smoker   Smokeless tobacco  . Not on file    History  Alcohol Use No    Family History  Problem Relation Age of Onset  . Prostate cancer Father     Review of Systems: Constitutional: no fever chills diaphoresis or fatigue or change  in weight.  Head and neck: no hearing loss, no epistaxis, no photophobia or visual disturbance. Respiratory: No cough, shortness of breath or wheezing. Cardiovascular: No chest pain peripheral edema, palpitations. Gastrointestinal: No abdominal distention, no abdominal pain, no change in bowel habits hematochezia or melena. Genitourinary: No dysuria, no frequency, no urgency, no nocturia. Musculoskeletal:No arthralgias, no back pain, no gait disturbance or myalgias. Neurological: No dizziness, no headaches, no numbness, no seizures, no syncope, no weakness, no tremors. Hematologic: No lymphadenopathy, no easy bruising. Psychiatric: No confusion, no hallucinations, no sleep disturbance.    Physical Exam: Filed Vitals:   05/10/14 1338  BP: 104/62  Pulse: 77   the general appearance reveals an elderly gentleman in no acute distress.The  head and neck exam reveals pupils equal and reactive.  Extraocular movements are full.  There is no scleral icterus.  The mouth and pharynx are normal.  The neck is supple.  The carotids reveal no bruits.  The jugular venous pressure is normal.  The  thyroid is not enlarged.  There is no lymphadenopathy.  The chest is clear to percussion and auscultation.  There are no rales or rhonchi.  Expansion of the chest is symmetrical.  The precordium is quiet.  The first heart sound is normal.  The second heart sound is physiologically split.  There is a grade 2/6 systolic murmur of aortic stenosis at the base.  No diastolic murmur. There is no abnormal lift or heave.  The abdomen is soft and nontender.  The bowel sounds are normal.  The liver and spleen are not enlarged.  There are no abdominal masses.  There are no abdominal bruits.  Extremities reveal good pedal pulses.  There is no phlebitis or edema.  There is no cyanosis or clubbing.  Strength is normal and symmetrical in all extremities.  There is no lateralizing weakness.  There are no sensory deficits.  The skin is warm and dry.  There is no rash.  EKG shows atrial fibrillation with controlled ventricular response.  No acute ischemic changes.  Occasional PVCs.  Assessment / Plan: 1.  Ischemic heart disease status post CABG in 1989 and status post drug-eluting stent to his saphenous vein graft to the right coronary artery in 2007.  Negative nuclear stress test in 2008 2. permanent atrial fibrillation on chronic Coumadin. 3. Hypercholesterolemia 4. type 2 diabetes mellitus 5. moderate aortic stenosis  Plan: Continue on same medication.  Recheck in 6 months for office visit.

## 2014-06-13 ENCOUNTER — Encounter: Payer: Self-pay | Admitting: Podiatry

## 2014-06-13 ENCOUNTER — Ambulatory Visit (INDEPENDENT_AMBULATORY_CARE_PROVIDER_SITE_OTHER): Payer: Medicare Other | Admitting: Podiatry

## 2014-06-13 VITALS — BP 133/70 | HR 86 | Resp 12

## 2014-06-13 DIAGNOSIS — L84 Corns and callosities: Secondary | ICD-10-CM

## 2014-06-13 DIAGNOSIS — M79676 Pain in unspecified toe(s): Secondary | ICD-10-CM

## 2014-06-13 DIAGNOSIS — B351 Tinea unguium: Secondary | ICD-10-CM

## 2014-06-13 DIAGNOSIS — E1140 Type 2 diabetes mellitus with diabetic neuropathy, unspecified: Secondary | ICD-10-CM

## 2014-06-13 NOTE — Progress Notes (Signed)
Patient ID: Tomma RakersWayne Ishmael, male   DOB: Jul 27, 1922, 81 y.o.   MRN: [id number]  Subjective: This patient presents for follow-up visit of 03/12/2014 for debridement of mycotic toenails and keratoses  Objective: Hard of hearing orientated 3 The toenails are elongated, discolored, hypertrophic 6-10 Large keratoses distal second left toe with bleeding at the base Hammertoe deformities diffusely 2 through 5 bilaterally  Assessment: Onychomycoses 6-10 Diabetic peripheral neuropathy Pre-ulcerative callus/corn distal second left toe  Plan: Debrided toenails 10 and keratoses 1 without a bleeding  Dispensed silicone toe To wear on the second left toe daily  Reappoint 10 weeks

## 2014-06-13 NOTE — Patient Instructions (Signed)
Wear the toe On the second left toe daily Diabetes and Foot Care Diabetes may cause you to have problems because of poor blood supply (circulation) to your feet and legs. This may cause the skin on your feet to become thinner, break easier, and heal more slowly. Your skin may become dry, and the skin may peel and crack. You may also have nerve damage in your legs and feet causing decreased feeling in them. You may not notice minor injuries to your feet that could lead to infections or more serious problems. Taking care of your feet is one of the most important things you can do for yourself.  HOME CARE INSTRUCTIONS  Wear shoes at all times, even in the house. Do not go barefoot. Bare feet are easily injured.  Check your feet daily for blisters, cuts, and redness. If you cannot see the bottom of your feet, use a mirror or ask someone for help.  Wash your feet with warm water (do not use hot water) and mild soap. Then pat your feet and the areas between your toes until they are completely dry. Do not soak your feet as this can dry your skin.  Apply a moisturizing lotion or petroleum jelly (that does not contain alcohol and is unscented) to the skin on your feet and to dry, brittle toenails. Do not apply lotion between your toes.  Trim your toenails straight across. Do not dig under them or around the cuticle. File the edges of your nails with an emery board or nail file.  Do not cut corns or calluses or try to remove them with medicine.  Wear clean socks or stockings every day. Make sure they are not too tight. Do not wear knee-high stockings since they may decrease blood flow to your legs.  Wear shoes that fit properly and have enough cushioning. To break in new shoes, wear them for just a few hours a day. This prevents you from injuring your feet. Always look in your shoes before you put them on to be sure there are no objects inside.  Do not cross your legs. This may decrease the blood flow to  your feet.  If you find a minor scrape, cut, or break in the skin on your feet, keep it and the skin around it clean and dry. These areas may be cleansed with mild soap and water. Do not cleanse the area with peroxide, alcohol, or iodine.  When you remove an adhesive bandage, be sure not to damage the skin around it.  If you have a wound, look at it several times a day to make sure it is healing.  Do not use heating pads or hot water bottles. They may burn your skin. If you have lost feeling in your feet or legs, you may not know it is happening until it is too late.  Make sure your health care provider performs a complete foot exam at least annually or more often if you have foot problems. Report any cuts, sores, or bruises to your health care provider immediately. SEEK MEDICAL CARE IF:   You have an injury that is not healing.  You have cuts or breaks in the skin.  You have an ingrown nail.  You notice redness on your legs or feet.  You feel burning or tingling in your legs or feet.  You have pain or cramps in your legs and feet.  Your legs or feet are numb.  Your feet always feel cold. SEEK IMMEDIATE  MEDICAL CARE IF:   There is increasing redness, swelling, or pain in or around a wound.  There is a red line that goes up your leg.  Pus is coming from a wound.  You develop a fever or as directed by your health care provider.  You notice a bad smell coming from an ulcer or wound. Document Released: 05/22/2000 Document Revised: 01/25/2013 Document Reviewed: 11/01/2012 [facility] Patient Information 2015 [place], [place]. This information is not intended to replace advice given to you by your health care provider. Make sure you discuss any questions you have with your health care provider.

## 2014-06-21 ENCOUNTER — Other Ambulatory Visit (HOSPITAL_COMMUNITY): Payer: Self-pay | Admitting: Geriatric Medicine

## 2014-06-21 ENCOUNTER — Ambulatory Visit (HOSPITAL_COMMUNITY): Payer: Medicare Other | Attending: Geriatric Medicine

## 2014-06-21 DIAGNOSIS — E785 Hyperlipidemia, unspecified: Secondary | ICD-10-CM | POA: Insufficient documentation

## 2014-06-21 DIAGNOSIS — R011 Cardiac murmur, unspecified: Secondary | ICD-10-CM

## 2014-06-21 DIAGNOSIS — R5383 Other fatigue: Secondary | ICD-10-CM | POA: Insufficient documentation

## 2014-06-21 DIAGNOSIS — I350 Nonrheumatic aortic (valve) stenosis: Secondary | ICD-10-CM | POA: Diagnosis not present

## 2014-06-21 DIAGNOSIS — E119 Type 2 diabetes mellitus without complications: Secondary | ICD-10-CM | POA: Insufficient documentation

## 2014-06-21 NOTE — Progress Notes (Signed)
2D Echo completed. 06/21/2014 

## 2014-07-16 ENCOUNTER — Ambulatory Visit (INDEPENDENT_AMBULATORY_CARE_PROVIDER_SITE_OTHER): Payer: Medicare Other | Admitting: Cardiology

## 2014-07-16 ENCOUNTER — Encounter: Payer: Self-pay | Admitting: Cardiology

## 2014-07-16 VITALS — BP 126/74 | HR 113 | Ht 76.0 in | Wt 199.4 lb

## 2014-07-16 DIAGNOSIS — E7800 Pure hypercholesterolemia, unspecified: Secondary | ICD-10-CM

## 2014-07-16 DIAGNOSIS — I482 Chronic atrial fibrillation, unspecified: Secondary | ICD-10-CM

## 2014-07-16 DIAGNOSIS — I350 Nonrheumatic aortic (valve) stenosis: Secondary | ICD-10-CM

## 2014-07-16 DIAGNOSIS — I25708 Atherosclerosis of coronary artery bypass graft(s), unspecified, with other forms of angina pectoris: Secondary | ICD-10-CM

## 2014-07-16 NOTE — Patient Instructions (Signed)
Your physician recommends that you continue on your current medications as directed. Please refer to the Current Medication list given to you today.  Your physician recommends that you schedule a follow-up appointment in: 4 month ov  

## 2014-07-16 NOTE — Progress Notes (Signed)
Cardiology Office Note   Date:  07/16/2014   ID:  Rodney RakersWayne Augustine, DOB 1922-10-11, MRN [id number]  PCP:  Ginette OttoSTONEKING,HAL Romel Dumond, MD  Cardiologist:   Cassell Clementhomas Adelae Yodice, MD   No chief complaint on file.     History of Present Illness: Rodney Montes is a 81 y.o. male who presents for follow-up office visit and to discuss recent echocardiogram.  This pleasant 81 year old gentleman is seen for a scheduled four-month followup office visit. He is a recent widower. He lives by himself in his patio home [facility]. The patient has known ischemic heart disease. He had coronary artery bypass graft surgery in 1989. He had recurrent angina in 2007 and had a drug-eluting stent placed to the saphenous vein graft to the right coronary artery.  In 2008 he had a nuclear stress test which was negative. Since then he has had no recurrent angina pectoris and has been doing well. The patient has a known heart murmur. His last echocardiogram was done on 11/12/11 and showed an ejection fraction of 60% and he has moderate aortic stenosis. The patient does not tolerate statins. He developed severe myalgias on statins. His primary care physician Dr. Pete GlatterStoneking follows his Coumadin and his lipids. His echocardiogram on 06/21/14 showed at least moderate aortic stenosis and mild aortic insufficiency.  There was mild mitral regurgitation.  The mean aortic valve gradient was 19.  His ejection fraction is 50-55%. Since last visit the patient denies any new cardiac symptoms.  He has not been experiencing any exertional dizziness or syncope.  He denies exertional chest pain or symptoms of CHF.  Past Medical History  Diagnosis Date  . Atrial fibrillation   . IHD (ischemic heart disease)   . Depression   . Coronary artery disease   . Hyperlipidemia   . Hypertension   . Chronic anticoagulation   . Diabetes mellitus without complication     Past Surgical History  Procedure Laterality Date  . Cardiac catheterization   09/28/2005    EF 60%  . Cardiac catheterization  09/03/87    EF 83%  . Coronary stent placement  2007    DRUG ELUTING STENT PLACED TO THE SAPHENOUS VEIN GRAFT TO THR RIGHT CORONARY ARTERY  . Coronary artery bypass graft    . Tonsillectomy and adenoidectomy    . Doppler echocardiography  09/16/1998    EF 60%  . Cardiovascular stress test  07/16/2006    EF 49%     Current Outpatient Prescriptions  Medication Sig Dispense Refill  . Ascorbic Acid (VITAMIN C PO) Take 500 mg by mouth daily.     Marland Kitchen. aspirin 81 MG tablet Take 81 mg by mouth daily.    . citalopram (CELEXA) 10 MG tablet Take 10 mg by mouth daily.      . clopidogrel (PLAVIX) 75 MG tablet Take 75 mg by mouth daily.      Marland Kitchen. ezetimibe (ZETIA) 10 MG tablet Take 10 mg by mouth daily.      . furosemide (LASIX) 20 MG tablet Take 20 mg by mouth daily.    . lansoprazole (PREVACID) 30 MG capsule Take 30 mg by mouth as needed.     . meclizine (ANTIVERT) 25 MG tablet Take 25 mg by mouth 3 (three) times daily as needed.      . metFORMIN (GLUMETZA) 500 MG (MOD) 24 hr tablet Take 500 mg by mouth daily with breakfast.      . metoprolol (TOPROL-XL) 50 MG 24 hr tablet Take 50 mg  by mouth 2 (two) times daily.      . nitroGLYCERIN (NITROSTAT) 0.4 MG SL tablet Place 0.4 mg under the tongue every 5 (five) minutes as needed.      . simvastatin (ZOCOR) 40 MG tablet Take 40 mg by mouth daily.    . Vitamin D, Ergocalciferol, (DRISDOL) 50000 UNITS CAPS Take 50,000 Units by mouth every 7 (seven) days.      Marland Kitchen warfarin (COUMADIN) 5 MG tablet Take 5 mg by mouth as directed.       No current facility-administered medications for this visit.    Allergies:   Diltiazem and Simvastatin    Social History:  The patient  reports that he has never smoked. He does not have any smokeless tobacco history on file. He reports that he does not drink alcohol or use illicit drugs.   Family History:  The patient's family history includes Prostate cancer in his father.     ROS:  Please see the history of present illness.   Otherwise, review of systems are positive for none.   All other systems are reviewed and negative.    PHYSICAL EXAM: VS:  BP 126/74 mmHg  Pulse 113  Ht  (1.93 m)  Wt 199 lb 6.4 oz (90.447 kg)  BMI 24.28 kg/m2 , BMI Body mass index is 24.28 kg/(m^2). GEN: Well nourished, well developed, in no acute distress HEENT: normal Neck: no JVD, carotid bruits, or masses Cardiac: Pulse is irregularly irregular in atrial fibrillation.  no  rubs, or gallops,no edema .  There is a grade 2/6 systolic ejection murmur at the base.  No diastolic murmur.  No gallop Respiratory:  clear to auscultation bilaterally, normal work of breathing GI: soft, nontender, nondistended, + BS MS: no deformity or atrophy Skin: warm and dry, no rash Neuro:  Strength and sensation are intact Psych: euthymic mood, full affect   EKG:  EKG is ordered today. The ekg ordered today demonstrates atrial fibrillation with controlled ventricular response.  No left ventricular ischemia or strain.  Occasional PVC.   Recent Labs: 11/15/2013: ALT 23; BUN 16; Creatinine 0.9; Hemoglobin 15.2; Platelets 205.0; Potassium 4.0; Sodium 140    Lipid Panel    Component Value Date/Time   CHOL 191 11/15/2013 0845   TRIG 66.0 11/15/2013 0845   HDL 47.20 11/15/2013 0845   CHOLHDL 4 11/15/2013 0845   VLDL 13.2 11/15/2013 0845   LDLCALC 131* 11/15/2013 0845      Wt Readings from Last 3 Encounters:  07/16/14 199 lb 6.4 oz (90.447 kg)  05/10/14 197 lb (89.359 kg)  03/12/14 190 lb (86.183 kg)         ASSESSMENT AND PLAN:  1. Ischemic heart disease status post CABG in 1989 and status post drug-eluting stent to his saphenous vein graft to the right coronary artery in 2007. Negative nuclear stress test in 2008 2. permanent atrial fibrillation on chronic Coumadin. 3. Hypercholesterolemia 4. type 2 diabetes mellitus 5.  At least moderate aortic stenosis by echocardiogram.   The patient is not having any cardinal symptoms of symptomatic aortic stenosis.    Current medicines are reviewed at length with the patient today.  The patient does not have concerns regarding medicines.  The following changes have been made:  no change  Labs/ tests ordered today include: None No orders of the defined types were placed in this encounter.     Disposition:   FU with Dr. Patty Sermons in 4 months for office visit.  He will call  us if he begins having any cardinal symptoms of aortic stenosis such as chest pain, increasing dyspnea, or exertional dizziness or syncope.   Signed, Cassell Clement, MD  07/16/2014 8:38 AM    [facility] [address] [place], [place], [place]  [address] Phone: [phone]; Fax: [phone]

## 2014-07-17 ENCOUNTER — Telehealth: Payer: Self-pay | Admitting: Cardiology

## 2014-07-17 NOTE — Telephone Encounter (Signed)
Left message to call back  

## 2014-07-17 NOTE — Telephone Encounter (Signed)
New Msg       Pt son calling to get info from appt from yesterday.   States there were previous concerns about a heart murmur?   Please return son Dan's call [phone].

## 2014-07-18 NOTE — Telephone Encounter (Signed)
F/U      Pt son Jesusita OkaDan returning call please call back.

## 2014-07-19 ENCOUNTER — Ambulatory Visit
Admission: RE | Admit: 2014-07-19 | Discharge: 2014-07-19 | Disposition: A | Discharge status: Home / Self Care | Payer: Medicare Other | Source: Ambulatory Visit | Attending: Geriatric Medicine | Admitting: Geriatric Medicine

## 2014-07-19 ENCOUNTER — Other Ambulatory Visit: Payer: Self-pay | Admitting: Geriatric Medicine

## 2014-07-19 DIAGNOSIS — S060X0A Concussion without loss of consciousness, initial encounter: Secondary | ICD-10-CM

## 2014-07-19 NOTE — Telephone Encounter (Signed)
Spoke with patient yesterday and ok to share results of echo with his son Reviewed echo results with son

## 2014-09-12 ENCOUNTER — Ambulatory Visit: Payer: Medicare Other | Admitting: Podiatry

## 2014-11-14 ENCOUNTER — Encounter: Payer: Self-pay | Admitting: Cardiology

## 2014-11-14 ENCOUNTER — Ambulatory Visit (INDEPENDENT_AMBULATORY_CARE_PROVIDER_SITE_OTHER): Payer: Medicare Other | Admitting: Cardiology

## 2014-11-14 VITALS — BP 140/60 | HR 61 | Ht 76.0 in | Wt 192.8 lb

## 2014-11-14 DIAGNOSIS — I350 Nonrheumatic aortic (valve) stenosis: Secondary | ICD-10-CM | POA: Diagnosis not present

## 2014-11-14 DIAGNOSIS — I25708 Atherosclerosis of coronary artery bypass graft(s), unspecified, with other forms of angina pectoris: Secondary | ICD-10-CM | POA: Diagnosis not present

## 2014-11-14 DIAGNOSIS — E7800 Pure hypercholesterolemia, unspecified: Secondary | ICD-10-CM

## 2014-11-14 DIAGNOSIS — I482 Chronic atrial fibrillation, unspecified: Secondary | ICD-10-CM

## 2014-11-14 NOTE — Patient Instructions (Signed)
Medication Instructions:  Your physician recommends that you continue on your current medications as directed. Please refer to the Current Medication list given to you today.  Labwork: none  Testing/Procedures: none  Follow-Up: Your physician wants you to follow-up in: 4 month ov You will receive a reminder letter in the mail two months in advance. If you don't receive a letter, please call our office to schedule the follow-up appointment.     

## 2014-11-14 NOTE — Progress Notes (Signed)
Cardiology Office Note   Date:  11/14/2014   ID:  Rodney Montes, DOB 07/09/1922, MRN [id number]  PCP:  Ginette OttoSTONEKING,HAL Elbert Polyakov, MD  Cardiologist: Cassell Clementhomas Karinna Beadles MD  No chief complaint on file.     History of Present Illness: Rodney Montes is a 81 y.o. male who presents for scheduled four-month follow-up office visit.  This pleasant 81 year old gentleman is seen for a scheduled four-month followup office visit. He is a recent widower. He lives by himself in his patio home at [facility]. The patient has known ischemic heart disease. He had coronary artery bypass graft surgery in 1989. He had recurrent angina in 2007 and had a drug-eluting stent placed to the saphenous vein graft to the right coronary artery. In 2008 he had a nuclear stress test which was negative. Since then he has had no recurrent angina pectoris and has been doing well. The patient has a known heart murmur. His last echocardiogram was done on 11/12/11 and showed an ejection fraction of 60% and he has moderate aortic stenosis. The patient does not tolerate statins. He developed severe myalgias on statins. His primary care physician Dr. Pete GlatterStoneking follows his Coumadin and his lipids. His echocardiogram on 06/21/14 showed at least moderate aortic stenosis and mild aortic insufficiency. There was mild mitral regurgitation. The mean aortic valve gradient was 19. His ejection fraction is 50-55%. Since last visit the patient denies any new cardiac symptoms. He has not been experiencing any exertional dizziness or syncope. He denies exertional chest pain or symptoms of CHF. He has lost 7 pounds since last visit.  His appetite is fair.  He goes out to eat lunch and he has his dinner at the [facility].  [facility] to work out.  He does not use the swimming pool there.. Past Medical History  Diagnosis Date  . Atrial fibrillation   . IHD (ischemic heart disease)   . Depression   .  Coronary artery disease   . Hyperlipidemia   . Hypertension   . Chronic anticoagulation   . Diabetes mellitus without complication     Past Surgical History  Procedure Laterality Date  . Cardiac catheterization  09/28/2005    EF 60%  . Cardiac catheterization  09/03/87    EF 83%  . Coronary stent placement  2007    DRUG ELUTING STENT PLACED TO THE SAPHENOUS VEIN GRAFT TO THR RIGHT CORONARY ARTERY  . Coronary artery bypass graft    . Tonsillectomy and adenoidectomy    . Doppler echocardiography  09/16/1998    EF 60%  . Cardiovascular stress test  07/16/2006    EF 49%     Current Outpatient Prescriptions  Medication Sig Dispense Refill  . Ascorbic Acid (VITAMIN C PO) Take 500 mg by mouth daily.     Marland Kitchen. aspirin 81 MG tablet Take 81 mg by mouth daily.    . citalopram (CELEXA) 10 MG tablet Take 10 mg by mouth daily.      . clopidogrel (PLAVIX) 75 MG tablet Take 75 mg by mouth daily.      Marland Kitchen. ezetimibe (ZETIA) 10 MG tablet Take 10 mg by mouth daily.      . furosemide (LASIX) 20 MG tablet Take 20 mg by mouth daily.    . lansoprazole (PREVACID) 30 MG capsule Take 30 mg by mouth as needed.     . meclizine (ANTIVERT) 25 MG tablet Take 25 mg by mouth 3 (three)  times daily as needed.      . metFORMIN (GLUMETZA) 500 MG (MOD) 24 hr tablet Take 500 mg by mouth daily with breakfast.      . metoprolol (TOPROL-XL) 50 MG 24 hr tablet Take 50 mg by mouth 2 (two) times daily.      . nitroGLYCERIN (NITROSTAT) 0.4 MG SL tablet Place 0.4 mg under the tongue every 5 (five) minutes as needed.      . simvastatin (ZOCOR) 40 MG tablet Take 40 mg by mouth daily.    . Vitamin D, Ergocalciferol, (DRISDOL) 50000 UNITS CAPS Take 50,000 Units by mouth every 7 (seven) days.      Marland Kitchen warfarin (COUMADIN) 5 MG tablet Take 5 mg by mouth as directed.       No current facility-administered medications for this visit.    Allergies:   Diltiazem and Simvastatin    Social History:  The patient  reports that he has never  smoked. He does not have any smokeless tobacco history on file. He reports that he does not drink alcohol or use illicit drugs.   Family History:  The patient's family history includes Prostate cancer in his father.    ROS:  Please see the history of present illness.   Otherwise, review of systems are positive for none.   All other systems are reviewed and negative.    PHYSICAL EXAM: VS:  BP 140/60 mmHg  Pulse 61  Ht  (1.93 m)  Wt 192 lb 12.8 oz (87.454 kg)  BMI 23.48 kg/m2 , BMI Body mass index is 23.48 kg/(m^2). GEN: Well nourished, well developed, in no acute distress HEENT: normal Neck: no JVD, carotid bruits, or masses Cardiac: Irregularly irregular.  Grade 2/6 systolic ejection murmur of aortic stenosis at base.  Peripheral edema. Respiratory:  clear to auscultation bilaterally, normal work of breathing GI: soft, nontender, nondistended, + BS MS: no deformity or atrophy Skin: warm and dry, no rash Neuro:  Strength and sensation are intact Psych: euthymic mood, full affect   EKG:  EKG is not ordered today.     Recent Labs: 11/15/2013: ALT 23; BUN 16; Creatinine, Ser 0.9; Hemoglobin 15.2; Platelets 205.0; Potassium 4.0; Sodium 140    Lipid Panel    Component Value Date/Time   CHOL 191 11/15/2013 0845   TRIG 66.0 11/15/2013 0845   HDL 47.20 11/15/2013 0845   CHOLHDL 4 11/15/2013 0845   VLDL 13.2 11/15/2013 0845   LDLCALC 131* 11/15/2013 0845      Wt Readings from Last 3 Encounters:  11/14/14 192 lb 12.8 oz (87.454 kg)  07/16/14 199 lb 6.4 oz (90.447 kg)  05/10/14 197 lb (89.359 kg)        ASSESSMENT AND PLAN:  1. Ischemic heart disease status post CABG in 1989 and status post drug-eluting stent to his saphenous vein graft to the right coronary artery in 2007. Negative nuclear stress test in 2008 2. permanent atrial fibrillation on chronic Coumadin. 3. Hypercholesterolemia.  Followed by Dr. Pete Glatter.  The patient is on ezetimibe. 4. type 2 diabetes  mellitus.  No hypoglycemia.  Followed by Dr. Pete Glatter. 5. At least moderate aortic stenosis by echocardiogram. The patient is not having any cardinal symptoms of symptomatic aortic stenosis.   Current medicines are reviewed at length with the patient today.  The patient does not have concerns regarding medicines.  The following changes have been made:  no change  Labs/ tests ordered today include:  No orders of the defined types were placed  in this encounter.     Disposition: Continue current medication.  Try to maintain weight.  Recheck in 4 months for office visit  Signed, Cassell Clement MD 11/14/2014 8:02 AM    [facility] [address] [place], [place], [place]  [address] Phone: [phone]; Fax: [phone]

## 2015-02-20 ENCOUNTER — Encounter: Payer: Self-pay | Admitting: Cardiology

## 2015-04-01 ENCOUNTER — Ambulatory Visit: Payer: Medicare Other | Admitting: Cardiology

## 2015-04-10 ENCOUNTER — Ambulatory Visit (INDEPENDENT_AMBULATORY_CARE_PROVIDER_SITE_OTHER): Payer: Medicare Other | Admitting: Cardiology

## 2015-04-10 ENCOUNTER — Encounter: Payer: Self-pay | Admitting: Cardiology

## 2015-04-10 VITALS — BP 130/80 | HR 69 | Ht 76.0 in | Wt 184.8 lb

## 2015-04-10 DIAGNOSIS — I25708 Atherosclerosis of coronary artery bypass graft(s), unspecified, with other forms of angina pectoris: Secondary | ICD-10-CM

## 2015-04-10 DIAGNOSIS — I482 Chronic atrial fibrillation, unspecified: Secondary | ICD-10-CM

## 2015-04-10 DIAGNOSIS — I350 Nonrheumatic aortic (valve) stenosis: Secondary | ICD-10-CM

## 2015-04-10 NOTE — Progress Notes (Signed)
Cardiology Office Note   Date:  04/10/2015   ID:  Rodney RakersWayne Gouge, DOB Jan 25, 1923, MRN [id number]  PCP:  Ginette OttoSTONEKING,HAL Windie Marasco, MD  Cardiologist: Cassell Clementhomas Alize Acy MD  No chief complaint on file.     History of Present Illness: Rodney Montes is a 81 y.o. male who presents for  follow-up visit  This pleasant 81 year old gentleman is seen for a scheduled  followup office visit. He is a recent widower. He lives by himself in his patio home at [facility]. The patient has known ischemic heart disease. He had coronary artery bypass graft surgery in 1989. He had recurrent angina in 2007 and had a drug-eluting stent placed to the saphenous vein graft to the right coronary artery. In 2008 he had a nuclear stress test which was negative. Since then he has had no recurrent angina pectoris and has been doing well. The patient has a known heart murmur. His last echocardiogram was done on 11/12/11 and showed an ejection fraction of 60% and he has moderate aortic stenosis. The patient does not tolerate statins. He developed severe myalgias on statins. His primary care physician Dr. Pete GlatterStoneking follows his Coumadin and his lipids. His echocardiogram on 06/21/14 showed at least moderate aortic stenosis and mild aortic insufficiency. There was mild mitral regurgitation. The mean aortic valve gradient was 19. His ejection fraction is 50-55%. Since last visit the patient denies any new cardiac symptoms. He has not been experiencing any exertional dizziness or syncope. He denies exertional chest pain or symptoms of CHF. He has lost 8 pounds since last visit. His appetite is fair. He goes out to eat lunch and he has his dinner at the [facility]. He states that his appetite is not as good as he used to be.  He denies any constipation or change in bowel habits or hematochezia or melena.   Past Medical History  Diagnosis Date  . Atrial fibrillation (HCC)   . IHD (ischemic heart disease)   . Depression   .  Coronary artery disease   . Hyperlipidemia   . Hypertension   . Chronic anticoagulation   . Diabetes mellitus without complication [facility](HCC)     Past Surgical History  Procedure Laterality Date  . Cardiac catheterization  09/28/2005    EF 60%  . Cardiac catheterization  09/03/87    EF 83%  . Coronary stent placement  2007    DRUG ELUTING STENT PLACED TO THE SAPHENOUS VEIN GRAFT TO THR RIGHT CORONARY ARTERY  . Coronary artery bypass graft    . Tonsillectomy and adenoidectomy    . Doppler echocardiography  09/16/1998    EF 60%  . Cardiovascular stress test  07/16/2006    EF 49%     Current Outpatient Prescriptions  Medication Sig Dispense Refill  . Ascorbic Acid (VITAMIN C PO) Take 500 mg by mouth daily.     Marland Kitchen. aspirin 81 MG tablet Take 81 mg by mouth daily.    . citalopram (CELEXA) 10 MG tablet Take 10 mg by mouth daily.      . clopidogrel (PLAVIX) 75 MG tablet Take 75 mg by mouth daily.      Marland Kitchen. ezetimibe (ZETIA) 10 MG tablet Take 10 mg by mouth daily.      . furosemide (LASIX) 20 MG tablet Take 20 mg by mouth daily.    . lansoprazole (PREVACID) 30 MG capsule Take 30 mg by mouth as needed (indigestion).     . meclizine (ANTIVERT) 25 MG tablet Take 25 mg  by mouth 3 (three) times daily as needed (dizziness).     . metFORMIN (GLUMETZA) 500 MG (MOD) 24 hr tablet Take 500 mg by mouth daily with breakfast.      . metoprolol (TOPROL-XL) 50 MG 24 hr tablet Take 50 mg by mouth 2 (two) times daily.      . nitroGLYCERIN (NITROSTAT) 0.4 MG SL tablet Place 0.4 mg under the tongue every 5 (five) minutes as needed (chest pain).     . simvastatin (ZOCOR) 40 MG tablet Take 40 mg by mouth daily.    . Vitamin D, Ergocalciferol, (DRISDOL) 50000 UNITS CAPS Take 50,000 Units by mouth every 7 (seven) days.      Marland Kitchen warfarin (COUMADIN) 5 MG tablet Take 5 mg by mouth as directed.       No current facility-administered medications for this visit.    Allergies:   Diltiazem and Simvastatin    Social History:   The patient  reports that he has never smoked. He does not have any smokeless tobacco history on file. He reports that he does not drink alcohol or use illicit drugs.   Family History:  The patient's family history includes Prostate cancer in his father.    ROS:  Please see the history of present illness.   Otherwise, review of systems are positive for none.   All other systems are reviewed and negative.    PHYSICAL EXAM: VS:  BP 130/80 mmHg  Pulse 69  Ht  (1.93 m)  Wt 184 lb 12.8 oz (83.825 kg)  BMI 22.50 kg/m2 , BMI Body mass index is 22.5 kg/(m^2). GEN: Well nourished, well developed, in no acute distress HEENT: normal Neck: no JVD, carotid bruits, or masses Cardiac: Irregularly irregular.  There is a grade 2/6 systolic ejection murmur at the aortic area. No rubs, or gallops,no edema  Respiratory:  clear to auscultation bilaterally, normal work of breathing GI: soft, nontender, nondistended, + BS MS: no deformity or atrophy Skin: warm and dry, no rash Neuro:  Strength and sensation are intact Psych: euthymic mood, full affect   EKG:  EKG is ordered today. The ekg ordered today demonstrates atrial fibrillation with controlled ventricular response of 69 bpm.  Occasional PVC.  Nonspecific ST-T changes.   Recent Labs: No results found for requested labs within last 365 days.    Lipid Panel    Component Value Date/Time   CHOL 191 11/15/2013 0845   TRIG 66.0 11/15/2013 0845   HDL 47.20 11/15/2013 0845   CHOLHDL 4 11/15/2013 0845   VLDL 13.2 11/15/2013 0845   LDLCALC 131* 11/15/2013 0845      Wt Readings from Last 3 Encounters:  04/10/15 184 lb 12.8 oz (83.825 kg)  11/14/14 192 lb 12.8 oz (87.454 kg)  07/16/14 199 lb 6.4 oz (90.447 kg)      ASSESSMENT AND PLAN:  1. Ischemic heart disease status post CABG in 1989 and status post drug-eluting stent to his saphenous vein graft to the right coronary artery in 2007. Negative nuclear stress test in 2008 2.  permanent atrial fibrillation on chronic Coumadin. 3. Hypercholesterolemia. Followed by Dr. Pete Glatter. The patient is on ezetimibe. 4. type 2 diabetes mellitus. No hypoglycemia. Followed by Dr. Pete Glatter. 5. At least moderate aortic stenosis by echocardiogram. The patient is not having any cardinal symptoms of symptomatic aortic stenosis.   Current medicines are reviewed at length with the patient today.  The patient does not have concerns regarding medicines.  The following changes have been made:  no change  Labs/ tests ordered today include:   Orders Placed This Encounter  Procedures  . EKG 12-Lead    Physician: Continue current medication.  He will return in 6 months for a follow-up office visit with his new cardiologist.  He understands that I will be retiring.    Karie Schwalbe MD 04/10/2015 5:54 PM    [facility] [address] [place], [place], [place]  [address] Phone: [phone]; Fax: [phone]

## 2015-04-10 NOTE — Patient Instructions (Signed)
Medication Instructions:  Your physician recommends that you continue on your current medications as directed. Please refer to the Current Medication list given to you today.  Labwork: none  Testing/Procedures: none  Follow-Up: Your physician recommends that you schedule a follow-up appointment in: 6 month ov/ekg with Lori G NP or Scott W PA  If you need a refill on your cardiac medications before your next appointment, please call your pharmacy.  

## 2015-05-20 ENCOUNTER — Other Ambulatory Visit: Payer: Self-pay | Admitting: Geriatric Medicine

## 2015-05-20 DIAGNOSIS — R296 Repeated falls: Secondary | ICD-10-CM

## 2015-05-23 ENCOUNTER — Other Ambulatory Visit: Payer: Medicare Other

## 2015-05-23 ENCOUNTER — Ambulatory Visit
Admission: RE | Admit: 2015-05-23 | Discharge: 2015-05-23 | Disposition: A | Discharge status: Home / Self Care | Payer: Medicare Other | Source: Ambulatory Visit | Attending: Geriatric Medicine | Admitting: Geriatric Medicine

## 2015-05-23 DIAGNOSIS — R296 Repeated falls: Secondary | ICD-10-CM

## 2015-05-25 ENCOUNTER — Inpatient Hospital Stay (HOSPITAL_COMMUNITY): Payer: Medicare Other

## 2015-05-25 ENCOUNTER — Inpatient Hospital Stay (HOSPITAL_COMMUNITY)
Admission: EM | Admit: 2015-05-25 | Discharge: 2015-06-09 | DRG: 871 | Disposition: E | Payer: Medicare Other | Attending: Internal Medicine | Admitting: Internal Medicine

## 2015-05-25 ENCOUNTER — Encounter (HOSPITAL_COMMUNITY): Payer: Self-pay

## 2015-05-25 ENCOUNTER — Emergency Department (HOSPITAL_COMMUNITY): Payer: Medicare Other

## 2015-05-25 DIAGNOSIS — J189 Pneumonia, unspecified organism: Secondary | ICD-10-CM | POA: Diagnosis present

## 2015-05-25 DIAGNOSIS — I481 Persistent atrial fibrillation: Secondary | ICD-10-CM | POA: Diagnosis not present

## 2015-05-25 DIAGNOSIS — R7989 Other specified abnormal findings of blood chemistry: Secondary | ICD-10-CM | POA: Diagnosis present

## 2015-05-25 DIAGNOSIS — E875 Hyperkalemia: Secondary | ICD-10-CM | POA: Diagnosis present

## 2015-05-25 DIAGNOSIS — L89150 Pressure ulcer of sacral region, unstageable: Secondary | ICD-10-CM | POA: Diagnosis present

## 2015-05-25 DIAGNOSIS — R778 Other specified abnormalities of plasma proteins: Secondary | ICD-10-CM | POA: Diagnosis present

## 2015-05-25 DIAGNOSIS — I739 Peripheral vascular disease, unspecified: Secondary | ICD-10-CM | POA: Diagnosis not present

## 2015-05-25 DIAGNOSIS — Y95 Nosocomial condition: Secondary | ICD-10-CM | POA: Diagnosis present

## 2015-05-25 DIAGNOSIS — I959 Hypotension, unspecified: Secondary | ICD-10-CM | POA: Diagnosis present

## 2015-05-25 DIAGNOSIS — I482 Chronic atrial fibrillation: Secondary | ICD-10-CM | POA: Diagnosis not present

## 2015-05-25 DIAGNOSIS — G9340 Encephalopathy, unspecified: Secondary | ICD-10-CM | POA: Diagnosis present

## 2015-05-25 DIAGNOSIS — I10 Essential (primary) hypertension: Secondary | ICD-10-CM | POA: Diagnosis present

## 2015-05-25 DIAGNOSIS — E785 Hyperlipidemia, unspecified: Secondary | ICD-10-CM | POA: Diagnosis present

## 2015-05-25 DIAGNOSIS — Z8673 Personal history of transient ischemic attack (TIA), and cerebral infarction without residual deficits: Secondary | ICD-10-CM

## 2015-05-25 DIAGNOSIS — A419 Sepsis, unspecified organism: Principal | ICD-10-CM | POA: Diagnosis present

## 2015-05-25 DIAGNOSIS — I2510 Atherosclerotic heart disease of native coronary artery without angina pectoris: Secondary | ICD-10-CM | POA: Diagnosis present

## 2015-05-25 DIAGNOSIS — L98499 Non-pressure chronic ulcer of skin of other sites with unspecified severity: Secondary | ICD-10-CM | POA: Diagnosis not present

## 2015-05-25 DIAGNOSIS — R4182 Altered mental status, unspecified: Secondary | ICD-10-CM | POA: Diagnosis present

## 2015-05-25 DIAGNOSIS — E7800 Pure hypercholesterolemia, unspecified: Secondary | ICD-10-CM | POA: Diagnosis present

## 2015-05-25 DIAGNOSIS — Z515 Encounter for palliative care: Secondary | ICD-10-CM | POA: Diagnosis present

## 2015-05-25 DIAGNOSIS — I4891 Unspecified atrial fibrillation: Secondary | ICD-10-CM | POA: Diagnosis present

## 2015-05-25 DIAGNOSIS — R680 Hypothermia, not associated with low environmental temperature: Secondary | ICD-10-CM | POA: Diagnosis present

## 2015-05-25 DIAGNOSIS — Z7901 Long term (current) use of anticoagulants: Secondary | ICD-10-CM | POA: Diagnosis not present

## 2015-05-25 DIAGNOSIS — K7200 Acute and subacute hepatic failure without coma: Secondary | ICD-10-CM | POA: Diagnosis present

## 2015-05-25 DIAGNOSIS — Z951 Presence of aortocoronary bypass graft: Secondary | ICD-10-CM

## 2015-05-25 DIAGNOSIS — R945 Abnormal results of liver function studies: Secondary | ICD-10-CM | POA: Diagnosis present

## 2015-05-25 DIAGNOSIS — I25810 Atherosclerosis of coronary artery bypass graft(s) without angina pectoris: Secondary | ICD-10-CM | POA: Diagnosis present

## 2015-05-25 DIAGNOSIS — L8990 Pressure ulcer of unspecified site, unspecified stage: Secondary | ICD-10-CM | POA: Insufficient documentation

## 2015-05-25 DIAGNOSIS — E860 Dehydration: Secondary | ICD-10-CM | POA: Diagnosis present

## 2015-05-25 DIAGNOSIS — E119 Type 2 diabetes mellitus without complications: Secondary | ICD-10-CM | POA: Diagnosis present

## 2015-05-25 DIAGNOSIS — R17 Unspecified jaundice: Secondary | ICD-10-CM

## 2015-05-25 DIAGNOSIS — Z66 Do not resuscitate: Secondary | ICD-10-CM | POA: Diagnosis present

## 2015-05-25 DIAGNOSIS — E861 Hypovolemia: Secondary | ICD-10-CM | POA: Diagnosis present

## 2015-05-25 DIAGNOSIS — N390 Urinary tract infection, site not specified: Secondary | ICD-10-CM | POA: Diagnosis present

## 2015-05-25 DIAGNOSIS — I70209 Unspecified atherosclerosis of native arteries of extremities, unspecified extremity: Secondary | ICD-10-CM

## 2015-05-25 DIAGNOSIS — Z955 Presence of coronary angioplasty implant and graft: Secondary | ICD-10-CM

## 2015-05-25 DIAGNOSIS — N179 Acute kidney failure, unspecified: Secondary | ICD-10-CM

## 2015-05-25 DIAGNOSIS — E870 Hyperosmolality and hypernatremia: Secondary | ICD-10-CM | POA: Diagnosis present

## 2015-05-25 DIAGNOSIS — F329 Major depressive disorder, single episode, unspecified: Secondary | ICD-10-CM | POA: Diagnosis present

## 2015-05-25 DIAGNOSIS — I70202 Unspecified atherosclerosis of native arteries of extremities, left leg: Secondary | ICD-10-CM | POA: Diagnosis present

## 2015-05-25 DIAGNOSIS — I350 Nonrheumatic aortic (valve) stenosis: Secondary | ICD-10-CM | POA: Diagnosis present

## 2015-05-25 DIAGNOSIS — R0600 Dyspnea, unspecified: Secondary | ICD-10-CM | POA: Diagnosis not present

## 2015-05-25 DIAGNOSIS — I714 Abdominal aortic aneurysm, without rupture: Secondary | ICD-10-CM | POA: Diagnosis present

## 2015-05-25 LAB — URINE MICROSCOPIC-ADD ON

## 2015-05-25 LAB — URINALYSIS, ROUTINE W REFLEX MICROSCOPIC
GLUCOSE, UA: NEGATIVE mg/dL
Ketones, ur: NEGATIVE mg/dL
Nitrite: NEGATIVE
PH: 5 (ref 5.0–8.0)
Protein, ur: NEGATIVE mg/dL
SPECIFIC GRAVITY, URINE: 1.018 (ref 1.005–1.030)

## 2015-05-25 LAB — I-STAT ARTERIAL BLOOD GAS, ED
ACID-BASE EXCESS: 6 mmol/L — AB (ref 0.0–2.0)
Bicarbonate: 28.9 mEq/L — ABNORMAL HIGH (ref 20.0–24.0)
O2 SAT: 92 %
PCO2 ART: 35.5 mmHg (ref 35.0–45.0)
PH ART: 7.519 — AB (ref 7.350–7.450)
TCO2: 30 mmol/L (ref 0–100)
pO2, Arterial: 58 mmHg — ABNORMAL LOW (ref 80.0–100.0)

## 2015-05-25 LAB — COMPREHENSIVE METABOLIC PANEL
ALBUMIN: 2.2 g/dL — AB (ref 3.5–5.0)
ALK PHOS: 152 U/L — AB (ref 38–126)
ALT: 43 U/L (ref 17–63)
ANION GAP: 12 (ref 5–15)
AST: 74 U/L — ABNORMAL HIGH (ref 15–41)
BUN: 51 mg/dL — ABNORMAL HIGH (ref 6–20)
CALCIUM: 8.4 mg/dL — AB (ref 8.9–10.3)
CO2: 28 mmol/L (ref 22–32)
Chloride: 112 mmol/L — ABNORMAL HIGH (ref 101–111)
Creatinine, Ser: 1.56 mg/dL — ABNORMAL HIGH (ref 0.61–1.24)
GFR calc Af Amer: 43 mL/min — ABNORMAL LOW (ref >60)
GFR calc non Af Amer: 37 mL/min — ABNORMAL LOW (ref >60)
GLUCOSE: 149 mg/dL — AB (ref 65–99)
Potassium: 5.3 mmol/L — ABNORMAL HIGH (ref 3.5–5.1)
SODIUM: 152 mmol/L — AB (ref 135–145)
Total Bilirubin: 4.1 mg/dL — ABNORMAL HIGH (ref 0.3–1.2)
Total Protein: 5.7 g/dL — ABNORMAL LOW (ref 6.5–8.1)

## 2015-05-25 LAB — CBC WITH DIFFERENTIAL/PLATELET
Basophils Absolute: 0 10*3/uL (ref 0.0–0.1)
Basophils Relative: 0 %
EOS PCT: 0 %
Eosinophils Absolute: 0 10*3/uL (ref 0.0–0.7)
HEMATOCRIT: 45.8 % (ref 39.0–52.0)
HEMOGLOBIN: 14.7 g/dL (ref 13.0–17.0)
LYMPHS ABS: 1.3 10*3/uL (ref 0.7–4.0)
LYMPHS PCT: 6 %
MCH: 31.7 pg (ref 26.0–34.0)
MCHC: 32.1 g/dL (ref 30.0–36.0)
MCV: 98.9 fL (ref 78.0–100.0)
MONOS PCT: 6 %
Monocytes Absolute: 1.3 10*3/uL — ABNORMAL HIGH (ref 0.1–1.0)
Neutro Abs: 18.4 10*3/uL — ABNORMAL HIGH (ref 1.7–7.7)
Neutrophils Relative %: 88 %
PLATELETS: 329 10*3/uL (ref 150–400)
RBC: 4.63 MIL/uL (ref 4.22–5.81)
RDW: 16.2 % — ABNORMAL HIGH (ref 11.5–15.5)
WBC: 21 10*3/uL — AB (ref 4.0–10.5)

## 2015-05-25 LAB — I-STAT CG4 LACTIC ACID, ED: Lactic Acid, Venous: 3.71 mmol/L (ref 0.5–2.0)

## 2015-05-25 MED ORDER — LORAZEPAM 2 MG/ML IJ SOLN
0.5000 mg | Freq: Once | INTRAMUSCULAR | Status: AC
  Administered 2015-05-25: 0.5 mg via INTRAVENOUS
  Filled 2015-05-25: qty 1

## 2015-05-25 MED ORDER — SODIUM CHLORIDE 0.9 % IV BOLUS (SEPSIS): 500.0000 mL | Freq: Once | INTRAVENOUS | Status: DC

## 2015-05-25 MED ORDER — INSULIN ASPART 100 UNIT/ML ~~LOC~~ SOLN
0.0000 [IU] | Freq: Three times a day (TID) | SUBCUTANEOUS | Status: DC
  Administered 2015-05-26 – 2015-05-28 (×7): 1 [IU] via SUBCUTANEOUS

## 2015-05-25 MED ORDER — VANCOMYCIN HCL 10 G IV SOLR
1250.0000 mg | INTRAVENOUS | Status: DC
  Administered 2015-05-26 – 2015-05-28 (×3): 1250 mg via INTRAVENOUS
  Filled 2015-05-25 (×3): qty 1250

## 2015-05-25 MED ORDER — SODIUM POLYSTYRENE SULFONATE 15 GM/60ML PO SUSP
45.0000 g | Freq: Once | ORAL | Status: AC
  Administered 2015-05-25: 45 g via RECTAL
  Filled 2015-05-25: qty 180

## 2015-05-25 MED ORDER — LEVALBUTEROL HCL 1.25 MG/0.5ML IN NEBU
1.2500 mg | INHALATION_SOLUTION | Freq: Four times a day (QID) | RESPIRATORY_TRACT | Status: DC
  Administered 2015-05-26 – 2015-05-27 (×8): 1.25 mg via RESPIRATORY_TRACT
  Filled 2015-05-25 (×8): qty 0.5

## 2015-05-25 MED ORDER — DEXTROSE 5 % IV SOLN
2.0000 g | INTRAVENOUS | Status: DC
  Administered 2015-05-26 – 2015-05-27 (×2): 2 g via INTRAVENOUS
  Filled 2015-05-25 (×3): qty 2

## 2015-05-25 MED ORDER — LEVALBUTEROL HCL 1.25 MG/0.5ML IN NEBU: 1.2500 mg | INHALATION_SOLUTION | Freq: Four times a day (QID) | RESPIRATORY_TRACT | Status: DC

## 2015-05-25 MED ORDER — METOPROLOL TARTRATE 1 MG/ML IV SOLN
2.5000 mg | Freq: Two times a day (BID) | INTRAVENOUS | Status: DC
  Administered 2015-05-26 (×2): 2.5 mg via INTRAVENOUS
  Filled 2015-05-25 (×3): qty 5

## 2015-05-25 MED ORDER — CEFEPIME HCL 2 G IJ SOLR
2.0000 g | Freq: Once | INTRAMUSCULAR | Status: AC
  Administered 2015-05-25: 2 g via INTRAVENOUS
  Filled 2015-05-25: qty 2

## 2015-05-25 MED ORDER — DEXTROSE-NACL 5-0.45 % IV SOLN
INTRAVENOUS | Status: DC
  Administered 2015-05-25: via INTRAVENOUS

## 2015-05-25 MED ORDER — PANTOPRAZOLE SODIUM 40 MG IV SOLR
40.0000 mg | INTRAVENOUS | Status: DC
  Administered 2015-05-26 – 2015-05-28 (×4): 40 mg via INTRAVENOUS
  Filled 2015-05-25 (×4): qty 40

## 2015-05-25 MED ORDER — TOBRAMYCIN-DEXAMETHASONE 0.3-0.1 % OP SUSP
2.0000 [drp] | OPHTHALMIC | Status: AC
Start: 2015-05-25 — End: 2015-05-26
  Administered 2015-05-26 (×4): 2 [drp] via OPHTHALMIC
  Filled 2015-05-25: qty 2.5

## 2015-05-25 MED ORDER — SODIUM CHLORIDE 0.9 % IV BOLUS (SEPSIS)
1000.0000 mL | Freq: Once | INTRAVENOUS | Status: AC
  Administered 2015-05-25: 1000 mL via INTRAVENOUS

## 2015-05-25 MED ORDER — VANCOMYCIN HCL IN DEXTROSE 1-5 GM/200ML-% IV SOLN
1000.0000 mg | Freq: Once | INTRAVENOUS | Status: AC
  Administered 2015-05-25: 1000 mg via INTRAVENOUS
  Filled 2015-05-25: qty 200

## 2015-05-25 MED ORDER — SODIUM CHLORIDE 0.9 % IV SOLN: INTRAVENOUS | Status: DC

## 2015-05-25 MED ORDER — HEPARIN SODIUM (PORCINE) 5000 UNIT/ML IJ SOLN: 5000.0000 [IU] | Freq: Three times a day (TID) | INTRAMUSCULAR | Status: DC

## 2015-05-25 NOTE — Consult Note (Signed)
[facility] [place]  Patient name: Rodney Montes MRN: [id number] DOB: 1923-03-15 Sex: male  REASON FOR CONSULT: 5.5 cm infrarenal abdominal aortic aneurysm  HPI: Rodney Montes is a 81 y.o. male, who was admitted today with altered mental status and sepsis. He was found to have a pneumonia. His workup included a CT scan of the abdomen which showed an infrarenal 5.5 cm aneurysm with possible dissection. Or this reason, vascular surgery was consult.  The patient is not responding to questions and is moving around in bed. I am unable to obtain any meaningful history from the patient. He does not appear to be complaining of abdominal pain or back pain.  Past Medical History  Diagnosis Date  . Atrial fibrillation (HCC)   . IHD (ischemic heart disease)   . Depression   . Coronary artery disease   . Hyperlipidemia   . Hypertension   . Chronic anticoagulation   . Diabetes mellitus without complication (HCC)     Family History  Problem Relation Age of Onset  . Prostate cancer Father     SOCIAL HISTORY: Social History   Social History  . Marital Status: Widowed    Spouse Name: N/A  . Number of Children: N/A  . Years of Education: N/A   Occupational History  . Not on file.   Social History Main Topics  . Smoking status: Never Smoker   . Smokeless tobacco: Not on file  . Alcohol Use: No  . Drug Use: No  . Sexual Activity: Not on file   Other Topics Concern  . Not on file   Social History Narrative    Allergies  Allergen Reactions  . Diltiazem Other (See Comments)    Per [facility] 05/12/2015  . Simvastatin Other (See Comments)    Leg/ muscle ache    Current Facility-Administered Medications  Medication Dose Route Frequency Provider Last Rate Last Dose  . [START ON 05/26/2015] ceFEPIme (MAXIPIME) 2 g in dextrose 5 % 50 mL IVPB  2 g Intravenous Q24H Gwenlyn Found Carney, RPH      . dextrose 5 %-0.45 % sodium chloride infusion   Intravenous Continuous Lorretta Harp, MD      . Melene Muller ON 05/26/2015] insulin aspart (novoLOG) injection 0-9 Units  0-9 Units Subcutaneous TID WC Lorretta Harp, MD      . Melene Muller ON 05/26/2015] levalbuterol (XOPENEX) nebulizer solution 1.25 mg  1.25 mg Nebulization QID Lorretta Harp, MD      . metoprolol (LOPRESSOR) injection 2.5 mg  2.5 mg Intravenous Q12H Lorretta Harp, MD   2.5 mg at 05/16/2015 2200  . pantoprazole (PROTONIX) injection 40 mg  40 mg Intravenous Q24H Lorretta Harp, MD      . sodium chloride 0.9 % bolus 500 mL  500 mL Intravenous Once Blane Ohara, MD      . sodium polystyrene (KAYEXALATE) 15 GM/60ML suspension 45 g  45 g Rectal Once Lorretta Harp, MD      . tobramycin-dexamethasone [facility]) ophthalmic suspension 2 drop  2 drop Both Eyes 3 times per day Lorretta Harp, MD      . Melene Muller ON 05/26/2015] vancomycin (VANCOCIN) 1,250 mg in sodium chloride 0.9 % 250 mL IVPB  1,250 mg Intravenous Q24H Jessica C Carney, RPH        REVIEW OF SYSTEMS: Unable to obtain.  PHYSICAL EXAM: Filed Vitals:   05/31/2015 1939 06/08/2015 1942 06/01/2015 2045 05/18/2015 2100  BP: 142/92 120/73 120/75 95/62  Pulse: 76  94 101  Temp:      TempSrc:      Resp: 25 23 28 28   Weight:      SpO2: 90% 91% 94% 95%    GENERAL: The patient is a well-nourished male, in no acute distress. The vital signs are documented above. CARDIAC: There is an irregular rhythm.  VASCULAR: I do not detect carotid bruits. He has palpable femoral and popliteal pulses bilaterally. He has a right posterior tibial pulse. I cannot palpate pulses on the left foot. Both feet are warm and well-perfused. He has no significant lower extremity swelling. PULMONARY: There is good air exchange bilaterally without wheezing or rales. ABDOMEN: Soft and non-tender with normal pitched bowel sounds. His aneurysm is palpable and nontender. MUSCULOSKELETAL: There are no major deformities or cyanosis. NEUROLOGIC: No focal weakness or paresthesias are detected. SKIN: He has a pressure sore on the lateral  aspect of his left ankle. PSYCHIATRIC: The patient is moving around in bed but does not answer questions.  DATA:  I have reviewed his CT scan which shows a heavily calcified aorta. 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 He has a 5.5 cm infrarenal aneurysm. There is no evidence of leak or rupture.  MEDICAL ISSUES:  5.5 CM INFRARENAL ABDOMINAL AORTIC ANEURYSM: There is no evidence of leak of his aneurysm. I do not think this is in any way related to his presenting symptoms. We would consider elective repair of a  5.5 cm infrarenal aneurysm and a normal risk patient. However this patient is 81 years old and markedly debilitated and is not a good candidate for open surgery. In addition based on his CAT scan he does not appear to be a candidate for an endovascular approach.  I will arrange follow up ultrasound in 6 months in my office.  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 Given his age and markedly debilitated state I would not recommend an aggressive approach to his wound at this point. However, I think it would be helpful to have baseline ABIs.  Waverly Ferrariickson, Shawnay Bramel [facility] [place] Beeper: [phone]

## 2015-05-25 NOTE — ED Notes (Signed)
Pt. BIB EMS for altered mental status and possible sepsis. [facility]. [facility], moved to skilled nursing area 2 days ago following fall. Pt. Is now having increased confusion and combativeness. Pt. Is full code. At baseline is oriented to self. Pt. Diagnosed with UTI and started on rocephin today.

## 2015-05-25 NOTE — ED Notes (Signed)
MD at bedside. 

## 2015-05-25 NOTE — H&P (Addendum)
[facility] History and Physical  Rodney Montes RUE:[id number] DOB: [birth year] DOA: 06-16-2015  Referring physician: ED physician PCP: Ginette Otto, MD  Specialists:   Chief Complaint: Altered mental status  HPI: Rodney Montes is a 81 y.o. male with PMH of hypertension, hyperlipidemia, diabetes mellitus, depression, CVA, S/P of CABG, s/p of stent, atrial fibrillation on Coumadin, aortic stenosis, who presents with altered mental status.  Patient has AMS and is unable to provide any medical history, therefore, most of the history is obtained by discussing the case with ED physician, per EMS report, and with the nursing staff. It seems that pt was a resident at [place] home, but moved to skilled nursing area 2 days ago following fall. He has increased confusion and combativeness. At baseline pt is oriented to self. He was diagnosed with UTI and started on rocephin today. Pt has respiratory distress in emergency room, needs 4L nasal cannula oxygen to maintain oxygen saturation. When I saw pt, he cannot provide any history. He dose not seem to have cough or diarrhea. He moves all extremities. He is full code per report.   In ED, patient was found to have multifocal pneumonia by chest x-ray, lactate is 3.71, hypotension with blood pressure 89/79 which improved to 105/58 after 1 L of normal saline bolus, hyperkalemia with potassium 5.3 without EKG change, sodium 152, acute renal injury with creatinine 1.56, BUN 51, abnormal liver function with total bilirubin 4.1, AST 74, ALT 43, ALP 152. Pending UA and Ux. Patient is admitted to inpatient for further management and treatment.  Where does patient live?  SNF Can patient participate in ADLs?  None   Review of Systems: Could not be reviewed due to altered mental status  Allergy:  Allergies  Allergen Reactions  . Diltiazem Other (See Comments)    Per [facility] 16-Jun-2015  . Simvastatin Other (See Comments)    Leg/ muscle ache    Past  Medical History  Diagnosis Date  . Atrial fibrillation (HCC)   . IHD (ischemic heart disease)   . Depression   . Coronary artery disease   . Hyperlipidemia   . Hypertension   . Chronic anticoagulation   . Diabetes mellitus without complication [facility])     Past Surgical History  Procedure Laterality Date  . Cardiac catheterization  09/28/2005    EF 60%  . Cardiac catheterization  09/03/87    EF 83%  . Coronary stent placement  2007    DRUG ELUTING STENT PLACED TO THE SAPHENOUS VEIN GRAFT TO THR RIGHT CORONARY ARTERY  . Coronary artery bypass graft    . Tonsillectomy and adenoidectomy    . Doppler echocardiography  09/16/1998    EF 60%  . Cardiovascular stress test  07/16/2006    EF 49%    Social History:  reports that he has never smoked. He does not have any smokeless tobacco history on file. He reports that he does not drink alcohol or use illicit drugs.  Family History:  Family History  Problem Relation Age of Onset  . Prostate cancer Father      Prior to Admission medications   Medication Sig Start Date End Date Taking? Authorizing Provider  acetaminophen (TYLENOL) 325 MG tablet Take 650 mg by mouth every 4 (four) hours as needed (pain).   Yes Historical Provider, MD  busPIRone (BUSPAR) 5 MG tablet Take 5 mg by mouth 2 (two) times daily. anxiety   Yes Historical Provider, MD  ciprofloxacin (CIPRO) 500 MG tablet Take 500 mg by  mouth 2 (two) times daily. 7 day course started 05/13/2015 4pm   Yes Historical Provider, MD  citalopram (CELEXA) 10 MG tablet Take 10 mg by mouth daily.     Yes Historical Provider, MD  ezetimibe (ZETIA) 10 MG tablet Take 10 mg by mouth daily.     Yes Historical Provider, MD  LORazepam (ATIVAN) 0.5 MG tablet Take 0.5 mg by mouth every 6 (six) hours as needed for anxiety.   Yes Historical Provider, MD  meclizine (ANTIVERT) 25 MG tablet Take 25 mg by mouth 3 (three) times daily as needed for dizziness (giddiness).    Yes Historical Provider, MD  metFORMIN  (GLUCOPHAGE) 500 MG tablet Take 500 mg by mouth daily with breakfast.   Yes Historical Provider, MD  metoprolol (TOPROL-XL) 50 MG 24 hr tablet Take 50 mg by mouth 2 (two) times daily. 8am, 8pm   Yes Historical Provider, MD  nitroGLYCERIN (NITROSTAT) 0.4 MG SL tablet Place 0.4 mg under the tongue every 5 (five) minutes as needed for chest pain.    Yes Historical Provider, MD  omeprazole (PRILOSEC) 20 MG capsule Take 20 mg by mouth daily before breakfast.   Yes Historical Provider, MD  tobramycin-dexamethasone (TOBRADEX) ophthalmic solution Place 2 drops into both eyes See admin instructions. Instill 2 drops 3 times daily (7am, 2pm, 7pm) for 5 days - start date 05/21/15   Yes Historical Provider, MD  vitamin C (ASCORBIC ACID) 500 MG tablet Take 500 mg by mouth daily.   Yes Historical Provider, MD  Vitamin D, Ergocalciferol, (DRISDOL) 50000 UNITS CAPS Take 50,000 Units by mouth every 7 (seven) days. On Sundays   Yes Historical Provider, MD  cefTRIAXone (ROCEPHIN) 1 G injection Inject 1 g into the muscle once.    Historical Provider, MD  furosemide (LASIX) 40 MG tablet Take 40 mg by mouth once.    Historical Provider, MD  haloperidol lactate (HALDOL) 5 MG/ML injection Inject 1 mg into the muscle once.    Historical Provider, MD  warfarin (COUMADIN) 5 MG tablet Take 5 mg by mouth every evening. 5pm    Historical Provider, MD    Physical Exam: Filed Vitals:   05/24/2015 1846 05/09/2015 1849 05/22/2015 1939 06/02/2015 1942  BP: 105/58  142/92 120/73  Pulse: 46 109 76   Temp:      TempSrc:      Resp: Weight:      SpO2:  94% 90% 91%   General: Combative. Dry mucous and membrane HEENT:       Eyes: PERRL, EOMI, no scleral icterus.       ENT: No discharge from the ears and nose, no pharynx injection, no tonsillar enlargement.        Neck: No JVD, no bruit, no mass felt. Heme: No neck lymph node enlargement. Cardiac: S1/S2, irregularly irregular rhythm with tachycardia, murmurs, No gallops or  rubs. Pulm: No rales, wheezing, rhonchi or rubs. Abd: Soft, nondistended, pt seems to have abdominal tenderness to deep palpation on my examination. No organomegaly, BS present. Ext: No pitting leg edema bilaterally. 2+DP/PT pulse bilaterally. Musculoskeletal: No joint deformities, No joint redness or warmth, no limitation of ROM in spin. Skin: No rashes.  Neuro: Not oriented X3, cranial nerves II-XII grossly intact, moves all extremities. Psych: Cannot be reviewed due to altered mental status  Labs on Admission:  Basic Metabolic Panel:  Recent Labs Lab 06/04/2015 1731  NA 152*  K 5.3*  CL 112*  CO2 28  GLUCOSE 149*  BUN 51*  CREATININE 1.56*  CALCIUM 8.4*   Liver Function Tests:  Recent Labs Lab 05/31/2015 1731  AST 74*  ALT 43  ALKPHOS 152*  BILITOT 4.1*  PROT 5.7*  ALBUMIN 2.2*   No results for input(s): LIPASE, AMYLASE in the last 168 hours. No results for input(s): AMMONIA in the last 168 hours. CBC:  Recent Labs Lab 05/30/2015 1731  WBC 21.0*  NEUTROABS 18.4*  HGB 14.7  HCT 45.8  MCV 98.9  PLT 329   Cardiac Enzymes: No results for input(s): CKTOTAL, CKMB, CKMBINDEX, TROPONINI in the last 168 hours.  BNP (last 3 results) No results for input(s): BNP in the last 8760 hours.  ProBNP (last 3 results) No results for input(s): PROBNP in the last 8760 hours.  CBG: No results for input(s): GLUCAP in the last 168 hours.  Radiological Exams on Admission: Dg Chest Port 1 View  05/24/2015  CLINICAL DATA:  81 year old male, history of altered mental status and possible sepsis. EXAM: PORTABLE CHEST 1 VIEW COMPARISON:  02/14/2010, 05/10/2007 FINDINGS: Cardiomediastinal silhouette projects enlarged from the comparison, potentially segment related by the low lung volumes. Dense atherosclerotic calcifications of the thoracic aorta and the aortic arch. Surgical changes of median sternotomy and CABG. Interval development of bilateral airspace opacities, involving the  upper and lower right lung, and the base of the left lung. No displaced fracture. IMPRESSION: Multifocal pneumonia. Atherosclerosis Signed, Yvone NeuJaime S. Loreta AveWagner, DO [facility] Electronically Signed   By: Gilmer MorJaime  Wagner D.O.   On: 05/24/2015 18:22    EKG: Independently reviewed. QTC 476, atrial fibrillation  Assessment/Plan Principal Problem:   HCAP (healthcare-associated pneumonia) Active Problems:   Atrial fibrillation (HCC)   Pure hypercholesterolemia   Diabetes mellitus without complication (HCC)   Hx of CABG   Aortic stenosis, moderate   CAD (coronary artery disease)   Sepsis (HCC)   Acute encephalopathy   Hyperkalemia   AKI (acute kidney injury) (HCC)   Hypernatremia  HCAP (healthcare-associated pneumonia) and sepsis: pt has multifocal pneumonia as shown in chest x-ray. He is septic with leukocytosis, hypotension, tachycardia, tachypnea and elevated lactate. He was hypotensive initially, bp is responding to IV fluid treatment.  - Will admit to SDU - IV Vancomycin and cefepime - Xopenex Neb prn for SOB - Urine legionella and S. pneumococcal antigen - Follow up blood culture x2, sputum culture and plus Flu pcr - will get Procalcitonin and trend lactic acid level per sepsis protocol - IVF: 2.5L of NS bolus in ED, followed by D5-1/2 NS 75 mL per hour of NS  - f/u UA and Ux  Acute encephalopathy: Likely multifactorial etiology, including HCAP, sepsis, hypernatremia, acute renal injury. He had a fall 2 days ago. He is on  Coumadin, will need to r/o intracranial bleeding -will treat underlying issues respectively -Frequent neurochecks -CT-head  Atrial Fibrillation: CHA2DS2-VASc Score is 4 needs oral anticoagulation. Patient is on Coumadin. INR is pending on admission. Heart rate is OK now (100-110). -Hold coumadin until CT-head and CT-abd negative -switch oral BB to IV metoprolol with holding parameter  HLD: Last LDL was  133 on 11/15/13. Patient's allergic to Zocor. Currently on Zetia here -hold oral home medication -Check FLP  DM-II: Last A1c no on record. Patient is taking metformin at home -SSI -Check A1c  CAD: s/p of stent and CABG. Not sure if has CP. -check trop x 3 -IV metoprolol  Hyperkalemia: Potassium 5.3, which is likely due to acute renal injury. No  T wave EKG changes. -Kayexalate 45 g per rectal  Hypernatremia: likely due to decreased oral liquid intake. Patient is clinically dry. -IVF as above -BMP q6h, avoid over correction.  AKI: Likely due to prerenal secondary to dehydration and continuation of diruetics. ATN is also possible - IVF as above - Check FeUrea - f/u CT-abdomen to r/o hydronephrosis - Hold Diuretics. Lasix  HTN: -Hold lasix -on IV metoprolol with holding parameter  Abnormal liver function and abdominal tenderness: Etiology is not clear. Elevated total bilirubin is at least a partially from sepsis. -check direct BR -Hepatitis panel -Lipase -CT-abd/pelvis without contrast  Addenum: CT-abd/pelvis showed 5.5 cm abdominal aortic aneurysm with displaced intimal calcification suggesting possibility of dissection. No evidence of rupture.  -Will stop Sq heparin and put him on SCD PPx.  -Consulted VVS, Dr. Edilia Bo will see pt. -Consulted PCCM, dr. Vaughan Basta is aware of this pt.    DVT ppx: SCD Code Status: Full code (this is per Report from SNF, will need to discuss with family further) Family Communication: None at bed side.    Disposition Plan: Admit to inpatient   Date of Service 06/06/2015    Lorretta Harp [facility] Pager [phone]  If 7PM-7AM, please contact night-coverage [email or web address] Password [email or web address] 05/24/2015, 8:25 PM

## 2015-05-25 NOTE — ED Provider Notes (Signed)
CSN: [id number]     Arrival date & time 05/18/2015  1659 History   First MD Initiated Contact with Patient 05/30/2015 1715     Chief Complaint  Patient presents with  . Altered Mental Status     (Consider location/radiation/quality/duration/timing/severity/associated sxs/prior Treatment) HPI Comments: 81 year old male with history of atrial fibrillation, cholesterol, diabetes, aortic stenosis presents from nursing home for worsening mental status.  Patient had a fall recently however had a CT scan done 2 days ago without any bleeding or acute findings.  Patient is gradually worsened since then.  Patient was diagnosed with urine infection given antibiotics today.  Patient became more combative and is not at baseline mentally.  Patient has a history of CABG.  Unable to get details from patient due to mental status.  No family at bedside.  Nursing home was concern for possible sepsis when they sent him over.  Patient is a 81 y.o. male presenting with altered mental status. The history is provided by the nursing home and the EMS personnel.  Altered Mental Status   Past Medical History  Diagnosis Date  . Atrial fibrillation (HCC)   . IHD (ischemic heart disease)   . Depression   . Coronary artery disease   . Hyperlipidemia   . Hypertension   . Chronic anticoagulation   . Diabetes mellitus without complication [facility])    Past Surgical History  Procedure Laterality Date  . Cardiac catheterization  09/28/2005    EF 60%  . Cardiac catheterization  09/03/87    EF 83%  . Coronary stent placement  2007    DRUG ELUTING STENT PLACED TO THE SAPHENOUS VEIN GRAFT TO THR RIGHT CORONARY ARTERY  . Coronary artery bypass graft    . Tonsillectomy and adenoidectomy    . Doppler echocardiography  09/16/1998    EF 60%  . Cardiovascular stress test  07/16/2006    EF 49%   Family History  Problem Relation Age of Onset  . Prostate cancer Father    Social History  Substance Use Topics  . Smoking status:  Never Smoker   . Smokeless tobacco: None  . Alcohol Use: No    Review of Systems  Unable to perform ROS: Mental status change      Allergies  Diltiazem and Simvastatin  Home Medications   Prior to Admission medications   Medication Sig Start Date End Date Taking? Authorizing Provider  acetaminophen (TYLENOL) 325 MG tablet Take 650 mg by mouth every 4 (four) hours as needed (pain).   Yes Historical Provider, MD  busPIRone (BUSPAR) 5 MG tablet Take 5 mg by mouth 2 (two) times daily. anxiety   Yes Historical Provider, MD  ciprofloxacin (CIPRO) 500 MG tablet Take 500 mg by mouth 2 (two) times daily. 7 day course started 05/19/2015 4pm   Yes Historical Provider, MD  citalopram (CELEXA) 10 MG tablet Take 10 mg by mouth daily.     Yes Historical Provider, MD  ezetimibe (ZETIA) 10 MG tablet Take 10 mg by mouth daily.     Yes Historical Provider, MD  LORazepam (ATIVAN) 0.5 MG tablet Take 0.5 mg by mouth every 6 (six) hours as needed for anxiety.   Yes Historical Provider, MD  meclizine (ANTIVERT) 25 MG tablet Take 25 mg by mouth 3 (three) times daily as needed for dizziness (giddiness).    Yes Historical Provider, MD  metFORMIN (GLUCOPHAGE) 500 MG tablet Take 500 mg by mouth daily with breakfast.   Yes Historical Provider, MD  metoprolol (TOPROL-XL)  50 MG 24 hr tablet Take 50 mg by mouth 2 (two) times daily. 8am, 8pm   Yes Historical Provider, MD  nitroGLYCERIN (NITROSTAT) 0.4 MG SL tablet Place 0.4 mg under the tongue every 5 (five) minutes as needed for chest pain.    Yes Historical Provider, MD  omeprazole (PRILOSEC) 20 MG capsule Take 20 mg by mouth daily before breakfast.   Yes Historical Provider, MD  tobramycin-dexamethasone (TOBRADEX) ophthalmic solution Place 2 drops into both eyes See admin instructions. Instill 2 drops 3 times daily (7am, 2pm, 7pm) for 5 days - start date 05/21/15   Yes Historical Provider, MD  vitamin C (ASCORBIC ACID) 500 MG tablet Take 500 mg by mouth daily.   Yes  Historical Provider, MD  Vitamin D, Ergocalciferol, (DRISDOL) 50000 UNITS CAPS Take 50,000 Units by mouth every 7 (seven) days. On Sundays   Yes Historical Provider, MD  cefTRIAXone (ROCEPHIN) 1 G injection Inject 1 g into the muscle once.    Historical Provider, MD  furosemide (LASIX) 40 MG tablet Take 40 mg by mouth once.    Historical Provider, MD  haloperidol lactate (HALDOL) 5 MG/ML injection Inject 1 mg into the muscle once.    Historical Provider, MD  warfarin (COUMADIN) 5 MG tablet Take 5 mg by mouth every evening. 5pm    Historical Provider, MD   BP 105/58 mmHg  Pulse 109  Temp(Src) 98.1 F (36.7 C) (Rectal)  Resp 24  Wt 185 lb (83.915 kg)  SpO2 94% Physical Exam  Constitutional: He appears well-developed.  HENT:  Head: Normocephalic and atraumatic.  Dry mucous membranes  Eyes: Right eye exhibits no discharge. Left eye exhibits no discharge.  Neck: Normal range of motion. Neck supple. No tracheal deviation present.  Cardiovascular: Regular rhythm.  Tachycardia present.   Pulmonary/Chest: He has rales (tachypnea, rales worse right base).  Abdominal: Soft. He exhibits no distension. There is no tenderness. There is no guarding.  Musculoskeletal: He exhibits no edema.  Neurological: GCS eye subscore is 3. GCS verbal subscore is 2. GCS motor subscore is 4.  Mild agitation, patient grossly moving all extremities bilateral but will not follow commands, not able to communicate verbally.  Pupils equal.  Difficult neuro exam.  Skin: Skin is warm. No rash noted. There is erythema.  Superficial sacral pressure ulcer  Psychiatric:  Encephalopathic  Nursing note and vitals reviewed.   ED Course  Procedures (including critical care time) CRITICAL CARE Performed by: Enid SkeensZAVITZ, Ransome Helwig M   Total critical care time: 75 minutes  Critical care time was exclusive of separately billable procedures and treating other patients.  Critical care was necessary to treat or prevent imminent or  life-threatening deterioration.  Critical care was time spent personally by me on the following activities: development of treatment plan with patient and/or surrogate as well as nursing, discussions with consultants, evaluation of patient's response to treatment, examination of patient, obtaining history from patient or surrogate, ordering and performing treatments and interventions, ordering and review of laboratory studies, ordering and review of radiographic studies, pulse oximetry and re-evaluation of patient's condition.  Labs Review Labs Reviewed  COMPREHENSIVE METABOLIC PANEL - Abnormal; Notable for the following:    Sodium 152 (*)    Potassium 5.3 (*)    Chloride 112 (*)    Glucose, Bld 149 (*)    BUN 51 (*)    Creatinine, Ser 1.56 (*)    Calcium 8.4 (*)    Total Protein 5.7 (*)    Albumin 2.2 (*)  AST 74 (*)    Alkaline Phosphatase 152 (*)    Total Bilirubin 4.1 (*)    GFR calc non Af Amer 37 (*)    GFR calc Af Amer 43 (*)    All other components within normal limits  CBC WITH DIFFERENTIAL/PLATELET - Abnormal; Notable for the following:    WBC 21.0 (*)    RDW 16.2 (*)    Neutro Abs 18.4 (*)    Monocytes Absolute 1.3 (*)    All other components within normal limits  URINALYSIS, ROUTINE W REFLEX MICROSCOPIC (NOT AT [facility]) - Abnormal; Notable for the following:    Color, Urine ORANGE (*)    Hgb urine dipstick LARGE (*)    Bilirubin Urine SMALL (*)    Leukocytes, UA SMALL (*)    All other components within normal limits  I-STAT CG4 LACTIC ACID, ED - Abnormal; Notable for the following:    Lactic Acid, Venous 3.71 (*)    All other components within normal limits  CULTURE, BLOOD (ROUTINE X 2)  CULTURE, BLOOD (ROUTINE X 2)  URINE CULTURE  URINE MICROSCOPIC-ADD ON  I-STAT CG4 LACTIC ACID, ED    Imaging Review Dg Chest Port 1 View  05/16/2015  CLINICAL DATA:  81 year old male, history of altered mental status and possible sepsis. EXAM: PORTABLE CHEST 1 VIEW  COMPARISON:  02/14/2010, 05/10/2007 FINDINGS: Cardiomediastinal silhouette projects enlarged from the comparison, potentially segment related by the low lung volumes. Dense atherosclerotic calcifications of the thoracic aorta and the aortic arch. Surgical changes of median sternotomy and CABG. Interval development of bilateral airspace opacities, involving the upper and lower right lung, and the base of the left lung. No displaced fracture. IMPRESSION: Multifocal pneumonia. Atherosclerosis Signed, Yvone Neu. Loreta Ave, DO [facility] Electronically Signed   By: Gilmer Mor D.O.   On: 05/28/2015 18:22   I have personally reviewed and evaluated these images and lab results as part of my medical decision-making.   EKG Interpretation   Date/Time:  Saturday May 25 2015 17:10:56 EST Ventricular Rate:  99 PR Interval:    QRS Duration: 92 QT Interval:  371 QTC Calculation: 476 R Axis:   68 Text Interpretation:  Atrial fibrillation Nonspecific repol abnormality,  diffuse leads ST depression V2 Confirmed by Miyani Cronic  MD, Dayton Sherr ([id number]) on  05/24/2015 5:16:15 PM      MDM   Final diagnoses:  HCAP (healthcare-associated pneumonia)  Acute encephalopathy  Sepsis, unspecified organism (HCC)   Worsening mental status, clinical concern for HCAP on exam. UA pending.  Broad abx, multuiple fluid boluses with lactate elevated, tachycardia.  Plan for tele vs step down. Full code per ems report.   [facility] paged. Accepted stepdown.  BP dropped to 80s, discussed 2.5 L bolus with nursing.   The patients results and plan were reviewed and discussed.   Any x-rays performed were independently reviewed by myself.   Differential diagnosis were considered with the presenting HPI.  Medications  ceFEPIme (MAXIPIME) 2 g in dextrose 5 % 50 mL IVPB (not administered)  sodium chloride 0.9 % bolus 500 mL (not administered)  ceFEPIme (MAXIPIME) 2 g in  dextrose 5 % 50 mL IVPB (not administered)  vancomycin (VANCOCIN) 1,250 mg in sodium chloride 0.9 % 250 mL IVPB (not administered)  0.9 %  sodium chloride infusion (not administered)  vancomycin (VANCOCIN) IVPB 1000 mg/200 mL premix (1,000 mg Intravenous New Bag/Given 05/16/2015 1829)  sodium chloride 0.9 % bolus 1,000 mL (1,000 mLs Intravenous New  Bag/Given 05/24/2015 1830)  sodium chloride 0.9 % bolus 1,000 mL (1,000 mLs Intravenous New Bag/Given 05/21/2015 1937)  LORazepam (ATIVAN) injection 0.5 mg (0.5 mg Intravenous Given 05/14/2015 1853)    Filed Vitals:   05/28/2015 1830 06/02/2015 1842 05/15/2015 1846 05/22/2015 1849  BP: 108/70  105/58   Pulse:   46 109  Temp:      TempSrc:      Resp: Weight:      SpO2:    94%    Final diagnoses:  HCAP (healthcare-associated pneumonia)  Acute encephalopathy  Sepsis, unspecified organism [facility])    Admission/ observation were discussed with the admitting physician, patient and/or family and they are comfortable with the plan.      Blane Ohara, MD 06/08/2015 [phone]

## 2015-05-25 NOTE — Progress Notes (Signed)
ANTIBIOTIC CONSULT NOTE - INITIAL  Pharmacy Consult for vancomycin and cefepime Indication: rule out sepsis, UTI  Allergies  Allergen Reactions  . Diltiazem Other (See Comments)    [facility]  . Simvastatin Other (See Comments)    Leg/ muscle ache    Patient Measurements: Weight: 185 lb (83.915 kg) Adjusted Body Weight: n/a Vital Signs: Temp: 98.1 F (36.7 C) (12/17 1732) Temp Source: Rectal (12/17 1732) BP: 139/114 mmHg (12/17 1800) Pulse Rate: 113 (12/17 1815) Intake/Output from previous day:   Intake/Output from this shift:    Labs:  Recent Labs  05/23/2015 1731  WBC 21.0*  HGB 14.7  PLT 329  CREATININE 1.56*   Estimated Creatinine Clearance: 37.3 mL/min (by C-G formula based on Cr of 1.56). No results for input(s): VANCOTROUGH, VANCOPEAK, VANCORANDOM, GENTTROUGH, GENTPEAK, GENTRANDOM, TOBRATROUGH, TOBRAPEAK, TOBRARND, AMIKACINPEAK, AMIKACINTROU, AMIKACIN in the last 72 hours.   Microbiology: No results found for this or any previous visit (from the past 720 hour(s)).  Medical History: Past Medical History  Diagnosis Date  . Atrial fibrillation (HCC)   . IHD (ischemic heart disease)   . Depression   . Coronary artery disease   . Hyperlipidemia   . Hypertension   . Chronic anticoagulation   . Diabetes mellitus without complication [facility](HCC)    Assessment: 81 yo male admitted with UTI, suspected sepsis.  Pharmacy asked to start empiric vancomycin and cefepime.  Scr 1.56, CrCl ~ 37 ml/min.  Vancomycin 1g currently being infused.  Also with order for 2   Goal of Therapy:  Vancomycin trough level 15-20 mcg/ml  Plan:  1. Cefepime 2g IV q 24 hrs. 2. Vancomycin 1g x 1, then 1250 mg IV q 24 hrs. 3. F/u cultures, renal function and clinical course.  Tad MooreJessica Payten Beaumier, Pharm D, BCPS  Clinical Pharmacist Pager [phone]  05/09/2015 6:41 PM

## 2015-05-25 NOTE — ED Notes (Signed)
Patient attempted to get out of bed and remove monitor equipment. Safety sitter placed at bedside.

## 2015-05-26 DIAGNOSIS — G9340 Encephalopathy, unspecified: Secondary | ICD-10-CM

## 2015-05-26 DIAGNOSIS — A419 Sepsis, unspecified organism: Principal | ICD-10-CM

## 2015-05-26 DIAGNOSIS — R778 Other specified abnormalities of plasma proteins: Secondary | ICD-10-CM | POA: Diagnosis present

## 2015-05-26 DIAGNOSIS — R7989 Other specified abnormal findings of blood chemistry: Secondary | ICD-10-CM | POA: Diagnosis present

## 2015-05-26 DIAGNOSIS — J189 Pneumonia, unspecified organism: Secondary | ICD-10-CM

## 2015-05-26 DIAGNOSIS — R945 Abnormal results of liver function studies: Secondary | ICD-10-CM

## 2015-05-26 DIAGNOSIS — E870 Hyperosmolality and hypernatremia: Secondary | ICD-10-CM

## 2015-05-26 DIAGNOSIS — I482 Chronic atrial fibrillation: Secondary | ICD-10-CM

## 2015-05-26 DIAGNOSIS — E875 Hyperkalemia: Secondary | ICD-10-CM

## 2015-05-26 LAB — BASIC METABOLIC PANEL
ANION GAP: 9 (ref 5–15)
ANION GAP: 12 (ref 5–15)
Anion gap: 10 (ref 5–15)
Anion gap: 11 (ref 5–15)
Anion gap: 13 (ref 5–15)
Anion gap: 10 (ref 5–15)
BUN: 45 mg/dL — ABNORMAL HIGH (ref 6–20)
BUN: 45 mg/dL — AB (ref 6–20)
BUN: 43 mg/dL — AB (ref 6–20)
BUN: 40 mg/dL — AB (ref 6–20)
BUN: 39 mg/dL — AB (ref 6–20)
BUN: 37 mg/dL — AB (ref 6–20)
CALCIUM: 8.1 mg/dL — AB (ref 8.9–10.3)
CALCIUM: 8 mg/dL — AB (ref 8.9–10.3)
CALCIUM: 7.8 mg/dL — AB (ref 8.9–10.3)
CALCIUM: 8.1 mg/dL — AB (ref 8.9–10.3)
CALCIUM: 7.9 mg/dL — AB (ref 8.9–10.3)
CALCIUM: 8 mg/dL — AB (ref 8.9–10.3)
CO2: 30 mmol/L (ref 22–32)
CO2: 29 mmol/L (ref 22–32)
CO2: 23 mmol/L (ref 22–32)
CO2: 29 mmol/L (ref 22–32)
CO2: 27 mmol/L (ref 22–32)
CO2: 22 mmol/L (ref 22–32)
CREATININE: 1.31 mg/dL — AB (ref 0.61–1.24)
CREATININE: 1.32 mg/dL — AB (ref 0.61–1.24)
CREATININE: 1.19 mg/dL (ref 0.61–1.24)
CREATININE: 1.23 mg/dL (ref 0.61–1.24)
Chloride: 115 mmol/L — ABNORMAL HIGH (ref 101–111)
Chloride: 112 mmol/L — ABNORMAL HIGH (ref 101–111)
Chloride: 117 mmol/L — ABNORMAL HIGH (ref 101–111)
Chloride: 116 mmol/L — ABNORMAL HIGH (ref 101–111)
Chloride: 117 mmol/L — ABNORMAL HIGH (ref 101–111)
Chloride: 119 mmol/L — ABNORMAL HIGH (ref 101–111)
Creatinine, Ser: 1.21 mg/dL (ref 0.61–1.24)
Creatinine, Ser: 1.09 mg/dL (ref 0.61–1.24)
GFR calc Af Amer: 59 mL/min — ABNORMAL LOW (ref >60)
GFR calc Af Amer: >60 mL/min (ref >60)
GFR calc non Af Amer: 46 mL/min — ABNORMAL LOW (ref >60)
GFR calc non Af Amer: 52 mL/min — ABNORMAL LOW (ref >60)
GFR, EST AFRICAN AMERICAN: 54 mL/min — AB (ref >60)
GFR, EST AFRICAN AMERICAN: 53 mL/min — AB (ref >60)
GFR, EST AFRICAN AMERICAN: 58 mL/min — AB (ref >60)
GFR, EST NON AFRICAN AMERICAN: 46 mL/min — AB (ref >60)
GFR, EST NON AFRICAN AMERICAN: 50 mL/min — AB (ref >60)
GFR, EST NON AFRICAN AMERICAN: 51 mL/min — AB (ref >60)
GFR, EST NON AFRICAN AMERICAN: 58 mL/min — AB (ref >60)
GLUCOSE: 127 mg/dL — AB (ref 65–99)
GLUCOSE: 143 mg/dL — AB (ref 65–99)
GLUCOSE: 142 mg/dL — AB (ref 65–99)
Glucose, Bld: 136 mg/dL — ABNORMAL HIGH (ref 65–99)
Glucose, Bld: 158 mg/dL — ABNORMAL HIGH (ref 65–99)
Glucose, Bld: 146 mg/dL — ABNORMAL HIGH (ref 65–99)
Potassium: 3.7 mmol/L (ref 3.5–5.1)
Potassium: 4.2 mmol/L (ref 3.5–5.1)
Potassium: 3.6 mmol/L (ref 3.5–5.1)
Potassium: 3.4 mmol/L — ABNORMAL LOW (ref 3.5–5.1)
Potassium: 3.5 mmol/L (ref 3.5–5.1)
Potassium: 3.9 mmol/L (ref 3.5–5.1)
SODIUM: 155 mmol/L — AB (ref 135–145)
SODIUM: 152 mmol/L — AB (ref 135–145)
Sodium: 153 mmol/L — ABNORMAL HIGH (ref 135–145)
Sodium: 155 mmol/L — ABNORMAL HIGH (ref 135–145)
Sodium: 153 mmol/L — ABNORMAL HIGH (ref 135–145)
Sodium: 153 mmol/L — ABNORMAL HIGH (ref 135–145)

## 2015-05-26 LAB — TROPONIN I
Troponin I: 0.17 ng/mL — ABNORMAL HIGH (ref <0.031)
Troponin I: 0.15 ng/mL — ABNORMAL HIGH (ref <0.031)
Troponin I: 0.12 ng/mL — ABNORMAL HIGH (ref <0.031)
Troponin I: 0.1 ng/mL — ABNORMAL HIGH (ref <0.031)

## 2015-05-26 LAB — STREP PNEUMONIAE URINARY ANTIGEN: STREP PNEUMO URINARY ANTIGEN: NEGATIVE

## 2015-05-26 LAB — PROCALCITONIN: Procalcitonin: 0.36 ng/mL

## 2015-05-26 LAB — GLUCOSE, CAPILLARY
GLUCOSE-CAPILLARY: 118 mg/dL — AB (ref 65–99)
Glucose-Capillary: 135 mg/dL — ABNORMAL HIGH (ref 65–99)
Glucose-Capillary: 114 mg/dL — ABNORMAL HIGH (ref 65–99)
Glucose-Capillary: 129 mg/dL — ABNORMAL HIGH (ref 65–99)

## 2015-05-26 LAB — MRSA PCR SCREENING: MRSA by PCR: NEGATIVE

## 2015-05-26 LAB — BILIRUBIN, DIRECT: Bilirubin, Direct: 1.2 mg/dL — ABNORMAL HIGH (ref 0.1–0.5)

## 2015-05-26 LAB — PROTIME-INR
INR: 2.61 — ABNORMAL HIGH (ref 0.00–1.49)
Prothrombin Time: 27.6 seconds — ABNORMAL HIGH (ref 11.6–15.2)

## 2015-05-26 LAB — INFLUENZA PANEL BY PCR (TYPE A & B)
H1N1FLUPCR: NOT DETECTED
INFLBPCR: NEGATIVE
Influenza A By PCR: NEGATIVE

## 2015-05-26 LAB — LIPASE, BLOOD: Lipase: 35 U/L (ref 11–51)

## 2015-05-26 LAB — CREATININE, URINE, RANDOM: Creatinine, Urine: 49.92 mg/dL

## 2015-05-26 LAB — APTT: aPTT: 45 seconds — ABNORMAL HIGH (ref 24–37)

## 2015-05-26 LAB — LACTIC ACID, PLASMA: Lactic Acid, Venous: 2.3 mmol/L (ref 0.5–2.0)

## 2015-05-26 MED ORDER — LORAZEPAM 2 MG/ML IJ SOLN
INTRAMUSCULAR | Status: AC
  Filled 2015-05-26: qty 1

## 2015-05-26 MED ORDER — DEXTROSE 5 % IV SOLN
INTRAVENOUS | Status: DC
  Administered 2015-05-26: 11:00:00 via INTRAVENOUS

## 2015-05-26 NOTE — Progress Notes (Signed)
Utilization Review Completed.Rodney Montes T12/18/2016  

## 2015-05-26 NOTE — Progress Notes (Signed)
During mouth examination black residue noticed in back of mouth. Unable to remove despite frequent mouth care

## 2015-05-26 NOTE — Progress Notes (Signed)
CRITICAL VALUE ALERT  Critical value received:  Lactic acid 2.3  Date of notification:  05/26/15  Time of notification:  0040  Critical value read back:YES  Nurse who received alert:  Janice NorrieMisty Ennis, RN, BSN  MD notified (1st page):  Keenan BachelorSofia  Time of first page:  [phone]  MD notified (2nd page):  Time of second page:  Responding MD:    Time MD responded:

## 2015-05-26 NOTE — Progress Notes (Signed)
[facility] PROGRESS NOTE  Rodney Montes UJW:[id number] DOB: [birth year] DOA: 06-01-2015 PCP: Ginette Otto, MD  Assessment/Plan: #1 sepsis Likely secondary to healthcare associated pneumonia. Patient presented with sepsis with a leukocytosis, hypotension, tachycardia, hypothermia, tachypnea with elevated lactic acid level. Patient on admission was hypotensive however blood pressure responding to IV fluids and antibiotics. Influenza PCR negative. Blood cultures pending. Sputum Gram stain and cultures pending. Will continue to trend pro calcitonin lactic acid level. Continue IV fluids. Continue nebulizer treatments. Continue empiric IV vancomycin and cefepime. Follow.  #2 healthcare associated pneumonia Per chest x-ray and CT abdomen and pelvis. Patient had presented with hypotension and hypothermia tachycardia tachypnea with a leukocytosis chest x-ray consistent with a multifocal pneumonia. Patient has been pancultured results are pending. Continue empiric IV vancomycin and IV cefepime.  #3 acute encephalopathy Likely multifactorial secondary to sepsis, healthcare associated pneumonia, hypernatremia, dehydration, acute renal injury. Patient with recent fall was on Coumadin. CT head negative for intracranial bleeding. Patient has a pancultured and results are pending. Continue empiric IV vancomycin and IV cefepime. Change IV fluids to D5W. Follow.  #4 hypernatremia Likely secondary to severe dehydration. Change IV fluids to D5W. Follow.  #5 leukocytosis Likely secondary to problem #1 and 2. Patient has been pancultured. Continue empiric vancomycin IV cefepime.  #6 hypotension Likely secondary to hypovolemia and sepsis. Patient has been pancultured. Blood pressure responding to IV fluids. Follow.  #7 atrial fibrillation CHA2DS2VASC score is 4. Patient was on Coumadin. INR is therapeutic. CT head is negative for any intracranial bleed. CT abdomen is negative. Continue IV beta  blocker. Continue to hold Coumadin for now may consider resuming in the morning. Follow.  #8 hyperlipidemia LDL was 133 on 11/15/2013. Patient with an allergy to Zocor. Was tolerating oral intake will place back on home regimen.  #9 diabetes mellitus type 2 Hemoglobin A1c pending. Hold oral hypoglycemic agents. Sliding scale insulin.  #10 coronary artery disease status post stent/CABG/elevated troponins Cardiac enzymes mildly elevated likely secondary to sepsis. Will check a 2-D echo. Continue IV beta blocker. Follow.  #11 hyperkalemia Resolved.  #12 acute renal failure Secondary to prerenal azotemia. Improved with hydration. Follow.  #13 5.5 cm infrarenal abdominal aortic aneurysm Patient has been assessed by vascular surgery and there is no evidence of leak. Per vascular surgery patient is 81 years old, debilitated and not a good candidate for surgery. Family is in agreement. Outpatient follow-up for surveillance ultrasound in 6 months with vascular surgery.  #14 abnormal LFTs and abdominal tenderness Likely secondary to shock liver from hypotension. Acute hepatitis panel pending. Follow.  #15 hypertension Diuretics on hold. On IV metoprolol.  #16 prophylaxis SCDs for DVT prophylaxis.  Code Status: DO NOT RESUSCITATE Family Communication: Updated daughter Gaines Cartmell cell phone number [phone] via telephone Disposition Plan: Remain in the step down unit. Continue current treatments and if no significant improvement in 48-72 hours will likely need palliative care consultation.   Consultants:  Vascular surgery: Dr. Edilia Bo 01-Jun-2015  Procedures:  CT head Jun 01, 2015  CT abdomen and pelvis 01-Jun-2015  Chest x-ray 2015/06/01  Antibiotics:  IV vancomycin 06/01/2015  IV cefepime 2015-06-01  HPI/Subjective: Patient is obtunded. Minimal response to noxious stimuli.  Objective: Filed Vitals:   05/26/15 0415 05/26/15 0846  BP: 120/58 99/70  Pulse: 112 83   Temp: 96.8 F (36 C) 96.8 F (36 C)  Resp: 22 33    Intake/Output Summary (Last 24 hours) at 05/26/15 0953 Last data filed at 05/26/15 0600  Gross per 24 hour  Intake    475 ml  Output    338 ml  Net    137 ml   Filed Weights   2015-01-31 1709 2015-01-31 2245  Weight: 83.915 kg (185 lb) 83.9 kg (184 lb 15.5 oz)    Exam:   General:  Obtunded  Cardiovascular: Irregularly irregular  Respiratory: ) Sounds anterior lung fields.  Abdomen: Soft, nontender, nondistended, positive bowel sounds.  Musculoskeletal: No clubbing cyanosis or edema. Left ankle wrapped.  Data Reviewed: Basic Metabolic Panel:  Recent Labs Lab 2015-01-31 1731 2015-01-31 2314 05/26/15 0306 05/26/15 0723  NA 152* 155* 152* 153*  K 5.3* 3.7 4.2 3.6  CL 112* 115* 112* 117*  CO2 28 30 29 23   GLUCOSE 149* 136* 158* 146*  BUN 51* 45* 45* 43*  CREATININE 1.56* 1.31* 1.32* 1.19  CALCIUM 8.4* 8.1* 8.0* 7.8*   Liver Function Tests:  Recent Labs Lab 2015-01-31 1731  AST 74*  ALT 43  ALKPHOS 152*  BILITOT 4.1*  PROT 5.7*  ALBUMIN 2.2*    Recent Labs Lab 2015-01-31 2314  LIPASE 35   No results for input(s): AMMONIA in the last 168 hours. CBC:  Recent Labs Lab 2015-01-31 1731  WBC 21.0*  NEUTROABS 18.4*  HGB 14.7  HCT 45.8  MCV 98.9  PLT 329   Cardiac Enzymes:  Recent Labs Lab 2015-01-31 2314 05/26/15 0514  TROPONINI 0.17* 0.15*   BNP (last 3 results) No results for input(s): BNP in the last 8760 hours.  ProBNP (last 3 results) No results for input(s): PROBNP in the last 8760 hours.  CBG:  Recent Labs Lab 05/26/15 0106  GLUCAP 135*    Recent Results (from the past 240 hour(s))  Culture, blood (routine x 2)     Status: None (Preliminary result)   Collection Time: 2015-01-31  5:19 PM  Result Value Ref Range Status   Specimen Description BLOOD LEFT FOOT  Final   Special Requests BOTTLES DRAWN AEROBIC AND ANAEROBIC 5ML  Final   Culture NO GROWTH < 24 HOURS  Final   Report Status  PENDING  Incomplete  Culture, blood (routine x 2)     Status: None (Preliminary result)   Collection Time: 2015-01-31  5:19 PM  Result Value Ref Range Status   Specimen Description BLOOD RIGHT HAND  Final   Special Requests BOTTLES DRAWN AEROBIC AND ANAEROBIC 2.5ML  Final   Culture NO GROWTH < 24 HOURS  Final   Report Status PENDING  Incomplete  MRSA PCR Screening     Status: None   Collection Time: 2015-01-31 10:03 PM  Result Value Ref Range Status   MRSA by PCR NEGATIVE NEGATIVE Final    Comment:        The GeneXpert MRSA Assay (FDA approved for NASAL specimens only), is one component of a comprehensive MRSA colonization surveillance program. It is not intended to diagnose MRSA infection nor to guide or monitor treatment for MRSA infections.      Studies: Ct Abdomen Pelvis Wo Contrast  12/02/2014  CLINICAL DATA:  Sepsis and confusion. History of diabetes, hypertension, atrial fibrillation, ischemic heart disease, depression, coronary artery disease, hyperlipidemia, and chronic anticoagulation. EXAM: CT ABDOMEN AND PELVIS WITHOUT CONTRAST TECHNIQUE: Multidetector CT imaging of the abdomen and pelvis was performed following the standard protocol without IV contrast. COMPARISON:  None. FINDINGS: Visualized lung bases demonstrate diffuse airspace disease in both lungs which may indicate edema, consolidation, or ARDS. Cardiac enlargement. Coronary artery and aortic calcifications. The unenhanced appearance of the liver, spleen,  gallbladder, pancreas, adrenal glands, inferior vena cava, and retroperitoneal lymph nodes is unremarkable. Probable parapelvic cyst in the left kidney. No hydronephrosis or hydroureter. No renal stones. Extensive calcification of the abdominal aorta, splenic artery, and superior mesenteric artery. There is an abdominal aortic aneurysm measuring 5.5 cm maximal AP dimension. There is displaced intimal calcification which may indicate dissection. No abnormal retroperitoneal  fluid collections. Calcification of the iliac arteries. The stomach, small bowel, and colon are not abnormally distended. No free air or free fluid in the abdomen. Abdominal wall musculature appears intact. Pelvis: Ectatic and calcified iliac and external iliac arteries. The appendix is not identified. Prostate gland is not enlarged. Bladder wall is not thickened. There is a tiny gas bubble within the bladder. This could be due to instrumentation or may indicate infection. Scattered diverticula in the sigmoid colon. No evidence of diverticulitis. No pelvic mass or lymphadenopathy. No free or loculated pelvic fluid collections. Lumbar scoliosis with degenerative changes in the lumbar spine and hips. No destructive bone lesions are appreciated. IMPRESSION: Diffuse airspace disease in both lungs possibly due to edema, consolidation, or ARDS. 5.5 cm abdominal aortic aneurysm with displaced intimal calcification suggesting possibility of dissection. No evidence of rupture. No evidence of bowel obstruction or inflammation. No loculated pelvic fluid collections. Electronically Signed   By: Burman Nieves M.D.   On: 05/28/2015 22:17   Ct Head Wo Contrast  05/21/2015  CLINICAL DATA:  Acute encephalopathy. Confusion, dementia. History of diabetes, hypertension, atrial fibrillation, ischemic heart disease. Chronic anticoagulation. EXAM: CT HEAD WITHOUT CONTRAST TECHNIQUE: Contiguous axial images were obtained from the base of the skull through the vertex without intravenous contrast. COMPARISON:  05/23/2015 FINDINGS: Diffuse cerebral atrophy. Ventricular dilatation consistent with central atrophy. Low-attenuation changes in the deep white matter consistent with small vessel ischemia. No mass effect or midline shift. No abnormal extra-axial fluid collections. Gray-white matter junctions are distinct. Basal cisterns are not effaced. No evidence of acute intracranial hemorrhage. No depressed skull fractures. Visualized  paranasal sinuses and mastoid air cells are not opacified. Polyp or retention cyst in the left nasal passage. Postoperative changes in the left globe. Vascular calcifications. IMPRESSION: No acute intracranial abnormalities. Chronic atrophy and small vessel disease. Retention cyst or polyp in the left nasal passage. Electronically Signed   By: Burman Nieves M.D.   On: 05/17/2015 21:57   Dg Chest Port 1 View  05/24/2015  CLINICAL DATA:  81 year old male, history of altered mental status and possible sepsis. EXAM: PORTABLE CHEST 1 VIEW COMPARISON:  02/14/2010, 05/10/2007 FINDINGS: Cardiomediastinal silhouette projects enlarged from the comparison, potentially segment related by the low lung volumes. Dense atherosclerotic calcifications of the thoracic aorta and the aortic arch. Surgical changes of median sternotomy and CABG. Interval development of bilateral airspace opacities, involving the upper and lower right lung, and the base of the left lung. No displaced fracture. IMPRESSION: Multifocal pneumonia. Atherosclerosis Signed, Yvone Neu. Loreta Ave, DO [facility] Electronically Signed   By: Gilmer Mor D.O.   On: 06/07/2015 18:22    Scheduled Meds: . ceFEPime (MAXIPIME) IV  2 g Intravenous Q24H  . insulin aspart  0-9 Units Subcutaneous TID WC  . levalbuterol  1.25 mg Nebulization QID  . LORazepam      . metoprolol  2.5 mg Intravenous Q12H  . pantoprazole (PROTONIX) IV  40 mg Intravenous Q24H  . sodium chloride  500 mL Intravenous Once  . tobramycin-dexamethasone  2 drop Both Eyes 3 times per day  .  vancomycin  1,250 mg Intravenous Q24H   Continuous Infusions: . dextrose 5 % 1,000 mL infusion    . dextrose 5 % and 0.45% NaCl 75 mL/hr at 05/18/2015 2340    Principal Problem:   Sepsis (HCC) Active Problems:   Atrial fibrillation (HCC)   Pure hypercholesterolemia   Diabetes mellitus without complication (HCC)   Hx of CABG   Aortic  stenosis, moderate   HCAP (healthcare-associated pneumonia)   CAD (coronary artery disease)   Acute encephalopathy   Hyperkalemia   AKI (acute kidney injury) (HCC)   Hypernatremia   Abnormal liver function   Elevated troponin    Time spent: 40 mins    [facility] MD [facility] Pager [phone]. If 7PM-7AM, please contact night-coverage at [email or web address], password [facility] 05/26/2015, 9:53 AM  LOS: 1 day

## 2015-05-26 NOTE — Progress Notes (Signed)
PT Cancellation Note  Patient Details Name: Rodney RakersWayne Wisham MRN: [id number] DOB: Jun 26, 1922   Cancelled Treatment:    Reason Eval/Treat Not Completed: Patient not medically ready. Will await medical stability prior to pursuing mobility evaluation.  Follow up next date.  Thank you,  Dennis BastMartin, Loda Bialas Galloway 05/26/2015, 12:40 PM

## 2015-05-27 ENCOUNTER — Inpatient Hospital Stay (HOSPITAL_COMMUNITY): Payer: Medicare Other

## 2015-05-27 ENCOUNTER — Ambulatory Visit (HOSPITAL_COMMUNITY): Payer: Medicare Other

## 2015-05-27 DIAGNOSIS — R0600 Dyspnea, unspecified: Secondary | ICD-10-CM

## 2015-05-27 LAB — URINE CULTURE: CULTURE: NO GROWTH

## 2015-05-27 LAB — HEPATITIS PANEL, ACUTE
HEP A IGM: NEGATIVE
Hep B C IgM: NEGATIVE
Hepatitis B Surface Ag: NEGATIVE

## 2015-05-27 LAB — COMPREHENSIVE METABOLIC PANEL
ALK PHOS: 144 U/L — AB (ref 38–126)
ALT: 37 U/L (ref 17–63)
AST: 46 U/L — ABNORMAL HIGH (ref 15–41)
Albumin: 1.9 g/dL — ABNORMAL LOW (ref 3.5–5.0)
Anion gap: 10 (ref 5–15)
BILIRUBIN TOTAL: 2.7 mg/dL — AB (ref 0.3–1.2)
BUN: 35 mg/dL — ABNORMAL HIGH (ref 6–20)
CALCIUM: 8 mg/dL — AB (ref 8.9–10.3)
CO2: 25 mmol/L (ref 22–32)
CREATININE: 1.2 mg/dL (ref 0.61–1.24)
Chloride: 117 mmol/L — ABNORMAL HIGH (ref 101–111)
GFR, EST AFRICAN AMERICAN: 60 mL/min — AB (ref >60)
GFR, EST NON AFRICAN AMERICAN: 51 mL/min — AB (ref >60)
Glucose, Bld: 157 mg/dL — ABNORMAL HIGH (ref 65–99)
Potassium: 3.8 mmol/L (ref 3.5–5.1)
Sodium: 152 mmol/L — ABNORMAL HIGH (ref 135–145)
Total Protein: 5.5 g/dL — ABNORMAL LOW (ref 6.5–8.1)

## 2015-05-27 LAB — CBC WITH DIFFERENTIAL/PLATELET
Basophils Absolute: 0 10*3/uL (ref 0.0–0.1)
Basophils Relative: 0 %
EOS ABS: 0.2 10*3/uL (ref 0.0–0.7)
Eosinophils Relative: 1 %
HCT: 43.7 % (ref 39.0–52.0)
Hemoglobin: 14.1 g/dL (ref 13.0–17.0)
LYMPHS ABS: 2 10*3/uL (ref 0.7–4.0)
Lymphocytes Relative: 9 %
MCH: 32 pg (ref 26.0–34.0)
MCHC: 32.3 g/dL (ref 30.0–36.0)
MCV: 99.3 fL (ref 78.0–100.0)
MONO ABS: 1.3 10*3/uL — AB (ref 0.1–1.0)
Monocytes Relative: 6 %
NEUTROS PCT: 84 %
Neutro Abs: 18.8 10*3/uL — ABNORMAL HIGH (ref 1.7–7.7)
Platelets: 255 10*3/uL (ref 150–400)
RBC: 4.4 MIL/uL (ref 4.22–5.81)
RDW: 16.7 % — AB (ref 11.5–15.5)
WBC: 22.3 10*3/uL — AB (ref 4.0–10.5)

## 2015-05-27 LAB — LEGIONELLA ANTIGEN, URINE

## 2015-05-27 LAB — PROTIME-INR
INR: 2.66 — AB (ref 0.00–1.49)
PROTHROMBIN TIME: 27.9 s — AB (ref 11.6–15.2)

## 2015-05-27 LAB — GLUCOSE, CAPILLARY
GLUCOSE-CAPILLARY: 115 mg/dL — AB (ref 65–99)
Glucose-Capillary: 136 mg/dL — ABNORMAL HIGH (ref 65–99)
Glucose-Capillary: 124 mg/dL — ABNORMAL HIGH (ref 65–99)
Glucose-Capillary: 133 mg/dL — ABNORMAL HIGH (ref 65–99)

## 2015-05-27 LAB — MAGNESIUM: Magnesium: 2.2 mg/dL (ref 1.7–2.4)

## 2015-05-27 LAB — HEMOGLOBIN A1C
Hgb A1c MFr Bld: 6.5 % — ABNORMAL HIGH (ref 4.8–5.6)
MEAN PLASMA GLUCOSE: 140 mg/dL

## 2015-05-27 LAB — UREA NITROGEN, URINE: Urea Nitrogen, Ur: 687 mg/dL

## 2015-05-27 MED ORDER — METOPROLOL TARTRATE 1 MG/ML IV SOLN: 2.5000 mg | Freq: Three times a day (TID) | INTRAVENOUS | Status: DC

## 2015-05-27 MED ORDER — IPRATROPIUM BROMIDE 0.02 % IN SOLN
0.5000 mg | Freq: Three times a day (TID) | RESPIRATORY_TRACT | Status: DC
  Administered 2015-05-28 (×3): 0.5 mg via RESPIRATORY_TRACT
  Filled 2015-05-27 (×3): qty 2.5

## 2015-05-27 MED ORDER — METOPROLOL TARTRATE 1 MG/ML IV SOLN
2.5000 mg | INTRAVENOUS | Status: AC | PRN
  Administered 2015-05-27 – 2015-05-28 (×3): 2.5 mg via INTRAVENOUS
  Filled 2015-05-27 (×3): qty 5

## 2015-05-27 MED ORDER — METOPROLOL TARTRATE 1 MG/ML IV SOLN
5.0000 mg | Freq: Three times a day (TID) | INTRAVENOUS | Status: DC
  Administered 2015-05-27 – 2015-05-28 (×5): 5 mg via INTRAVENOUS
  Filled 2015-05-27 (×5): qty 5

## 2015-05-27 MED ORDER — IPRATROPIUM BROMIDE 0.02 % IN SOLN
0.5000 mg | Freq: Four times a day (QID) | RESPIRATORY_TRACT | Status: DC
  Administered 2015-05-27 (×3): 0.5 mg via RESPIRATORY_TRACT
  Filled 2015-05-27 (×3): qty 2.5

## 2015-05-27 MED ORDER — COLLAGENASE 250 UNIT/GM EX OINT
TOPICAL_OINTMENT | Freq: Every day | CUTANEOUS | Status: DC
  Administered 2015-05-27 – 2015-05-28 (×2): via TOPICAL
  Filled 2015-05-27: qty 30

## 2015-05-27 MED ORDER — METOPROLOL TARTRATE 1 MG/ML IV SOLN
5.0000 mg | Freq: Once | INTRAVENOUS | Status: AC
  Administered 2015-05-27: 5 mg via INTRAVENOUS
  Filled 2015-05-27: qty 5

## 2015-05-27 MED ORDER — LEVALBUTEROL HCL 1.25 MG/0.5ML IN NEBU
1.2500 mg | INHALATION_SOLUTION | Freq: Three times a day (TID) | RESPIRATORY_TRACT | Status: DC
  Administered 2015-05-28 (×3): 1.25 mg via RESPIRATORY_TRACT
  Filled 2015-05-27 (×3): qty 0.5

## 2015-05-27 MED ORDER — CETYLPYRIDINIUM CHLORIDE 0.05 % MT LIQD: 7.0000 mL | Freq: Two times a day (BID) | OROMUCOSAL | Status: DC

## 2015-05-27 MED ORDER — CETYLPYRIDINIUM CHLORIDE 0.05 % MT LIQD
7.0000 mL | Freq: Two times a day (BID) | OROMUCOSAL | Status: DC
  Administered 2015-05-27 – 2015-05-28 (×4): 7 mL via OROMUCOSAL

## 2015-05-27 NOTE — Progress Notes (Signed)
[facility] facility can admit pt back when medically stable.  Per MD note pt disposition isn't clear at this time and might be palliative.  CSW and facility following in case pt is able to return to SNF.  Merlyn LotJenna Holoman, LCSWA Clinical Social Worker [phone]

## 2015-05-27 NOTE — Evaluation (Signed)
Occupational Therapy Evaluation Patient Details Name: Rodney RakersWayne Krasinski MRN: [id number] DOB: August 19, 1922 Today's Date: 05/27/2015    History of Present Illness is a 81 y.o. male with PMH of hyperension, hyperlipidemia, diabetes mellitus, depression, CVA, S/P of CABG, s/p of stent, atrial fibrillation on Coumadin, aortic stenosis, who presents with altered mental status. Found to have PNA and UTI. Also of note is recent fall at SNF   Clinical Impression   This 81 yo male admitted with above presents to acute OT with decreased arousal, decreased balance, decreased cognition all affecting his ability to help care for himself and increasing burden of care on caregivers. Pt will benefit from acute OT with follow up OT at SNF to work on increasing independence to decrease burden of care.    Follow Up Recommendations  SNF    Equipment Recommendations   (TBD at next venue)       Precautions / Restrictions Precautions Precautions: Fall Restrictions Weight Bearing Restrictions: No      Mobility Bed Mobility Overal bed mobility: Needs Assistance;+2 for physical assistance Bed Mobility: Supine to Sit;Sit to Supine     Supine to sit: Total assist;+2 for physical assistance Sit to supine: Total assist;+2 for physical assistance   General bed mobility comments: Pt did not attempt to try and A     Balance Overall balance assessment: Needs assistance Sitting-balance support: No upper extremity supported;Feet supported Sitting balance-Leahy Scale: Zero Sitting balance - Comments: Pt not attempting to try and maintain balance  (shifting all over the place)                                    ADL Overall ADL's : Needs assistance/impaired                                       General ADL Comments: Currently pt total A due to not opening eyes and not following commands     Vision Additional Comments: Unknown if prior issues and pt currently not opening eyes  entire session          Pertinent Vitals/Pain Pain Assessment: Faces Pain Score: 0-No pain        Extremity/Trunk Assessment Upper Extremity Assessment Upper Extremity Assessment: Generalized weakness   Lower Extremity Assessment Lower Extremity Assessment: Defer to PT evaluation          Cognition Arousal/Alertness: Lethargic Behavior During Therapy: Restless Overall Cognitive Status: No family/caregiver present to determine baseline cognitive functioning (not following commands)                                Home Living Family/patient expects to be discharged to:: Skilled nursing facility                                             OT Diagnosis: Generalized weakness;Cognitive deficits   OT Problem List: Decreased strength;Decreased activity tolerance;Impaired balance (sitting and/or standing);Decreased cognition;Decreased knowledge of use of DME or AE   OT Treatment/Interventions: Self-care/ADL training;Patient/family education;Therapeutic activities;Balance training;DME and/or AE instruction;Cognitive remediation/compensation    OT Goals(Current goals can be found in the care plan section) Acute Rehab OT Goals Patient Stated Goal:  unable OT Goal Formulation: Patient unable to participate in goal setting Time For Goal Achievement: 06/10/15 Potential to Achieve Goals: Fair  OT Frequency: Min 2X/week       Co-evaluation PT/OT/SLP Co-Evaluation/Treatment: Yes Reason for Co-Treatment: For patient/therapist safety   OT goals addressed during session: ADL's and self-care;Strengthening/ROM      End of Session Nurse Communication:  (Pt left on 4 liters of O2 due to mouth breathing and O2 sats high 80's on 3 liters)  Activity Tolerance: Patient limited by lethargy Patient left: in bed;with call bell/phone within reach;with bed alarm set   Time: 1610-9604 OT Time Calculation (min): 19 min Charges:  OT General Charges $OT Visit: 1  Procedure OT Evaluation $Initial OT Evaluation Tier I: 1 Procedure  Evette Georges [phone] 05/27/2015, 11:17 AM

## 2015-05-27 NOTE — Progress Notes (Signed)
[facility] PROGRESS NOTE  Rodney Montes NWG:[id number] DOB: [birth year] DOA: 05/12/2015 PCP: Ginette Otto, MD  Assessment/Plan: #1 sepsis Likely secondary to healthcare associated pneumonia. Patient presented with sepsis with a leukocytosis, hypotension, tachycardia, hypothermia, tachypnea with elevated lactic acid level. Patient on admission was hypotensive however blood pressure responding to IV fluids and antibiotics. Influenza PCR negative. Blood cultures pending. Sputum Gram stain and cultures pending. Will continue to trend pro calcitonin lactic acid level. Continue IV fluids. Continue nebulizer treatments. Continue empiric IV vancomycin and cefepime. Follow.  #2 healthcare associated pneumonia Per chest x-ray and CT abdomen and pelvis. Patient had presented with hypotension and hypothermia tachycardia tachypnea with a leukocytosis chest x-ray consistent with a multifocal pneumonia. Patient has been pancultured results are pending. Continue empiric IV vancomycin and IV cefepime.  #3 acute encephalopathy Likely multifactorial secondary to sepsis, healthcare associated pneumonia, hypernatremia, dehydration, acute renal injury. Patient with recent fall was on Coumadin. CT head negative for intracranial bleeding. Patient has a pancultured and results are pending. Continue empiric IV vancomycin and IV cefepime. Continue D5W. Follow.  #4 hypernatremia Likely secondary to severe dehydration. Hyponatremia slowly improving. Continue D5W. Follow.  #5 leukocytosis Likely secondary to problem #1 and 2. Patient has been pancultured. Continue empiric IV vancomycin IV cefepime.  #6 hypotension Likely secondary to hypovolemia and sepsis. Patient has been pancultured. Blood pressure responding to IV fluids. Follow.  #7 atrial fibrillation CHA2DS2VASC score is 4. Patient was on Coumadin. INR is therapeutic at 2.66. CT head is negative for any intracranial bleed. CT abdomen is negative.  Patient's heart rate ranging from 90s to 120s. Increase Lopressor to 5 mg IV every 8 hours and titrate as needed. Patient with an allergy to Cardizem which is unknown. Continue to hold Coumadin for now. Follow.  #8 hyperlipidemia LDL was 133 on 11/15/2013. Patient with an allergy to Zocor. Was tolerating oral intake will place back on home regimen.  #9 diabetes mellitus type 2 Hemoglobin A1c 6.5. Hold oral hypoglycemic agents. Sliding scale insulin.  #10 coronary artery disease status post stent/CABG/elevated troponins Cardiac enzymes mildly elevated likely secondary to sepsis. 2-D echo pending. Continue IV beta blocker. Follow.  #11 hyperkalemia Resolved.  #12 acute renal failure Secondary to prerenal azotemia. Improved with hydration. Follow.  #13 5.5 cm infrarenal abdominal aortic aneurysm Patient has been assessed by vascular surgery and there is no evidence of leak. Per vascular surgery patient is 81 years old, debilitated and not a good candidate for surgery. Family is in agreement. Outpatient follow-up for surveillance ultrasound in 6 months with vascular surgery.  #14 abnormal LFTs and abdominal tenderness Likely secondary to shock liver from hypotension. LFTs trending down. Acute hepatitis panel pending. Follow.  #15 hypertension Diuretics on hold. On IV metoprolol.  #16 prophylaxis SCDs for DVT prophylaxis.  Code Status: DO NOT RESUSCITATE Family Communication: Updated daughter Caige Almeda cell phone number [phone] via telephone Disposition Plan: Remain in the step down unit. Continue current treatments and if no significant improvement in 48-72 hours will likely need palliative care consultation.   Consultants:  Vascular surgery: Dr. Edilia Bo 05/13/2015  Procedures:  CT head 05/24/2015  CT abdomen and pelvis 06/02/2015  Chest x-ray 05/17/2015  2-D echo 05/27/2015  Antibiotics:  IV vancomycin 05/24/2015  IV cefepime  06/05/2015  HPI/Subjective: Patient is obtunded. Minimal response to noxious stimuli.  Objective: Filed Vitals:   05/27/15 0638 05/27/15 0639  BP:    Pulse: 50 78  Temp:    Resp: 31 24  Intake/Output Summary (Last 24 hours) at 05/27/15 0906 Last data filed at 05/27/15 0600  Gross per 24 hour  Intake 1381.25 ml  Output      0 ml  Net 1381.25 ml   Filed Weights   2015/06/22 1709 06-22-2015 2245  Weight: 83.915 kg (185 lb) 83.9 kg (184 lb 15.5 oz)    Exam:   General:  Obtunded  Cardiovascular: Irregularly irregular  Respiratory: Coarse BS anterior lung fields.  Abdomen: Soft, nontender, nondistended, positive bowel sounds.  Musculoskeletal: No clubbing cyanosis or edema. Left ankle wrapped.  Data Reviewed: Basic Metabolic Panel:  Recent Labs Lab 05/26/15 0723 05/26/15 1110 05/26/15 1453 05/26/15 1901 05/27/15 0305  NA 153* 155* 153* 153* 152*  K 3.6 3.4* 3.5 3.9 3.8  CL 117* 116* 117* 119* 117*  CO2 23 29 27 22 25   GLUCOSE 146* 127* 143* 142* 157*  BUN 43* 40* 39* 37* 35*  CREATININE 1.19 1.23 1.21 1.09 1.20  CALCIUM 7.8* 8.1* 7.9* 8.0* 8.0*  MG  --   --   --   --  2.2   Liver Function Tests:  Recent Labs Lab 06/22/15 1731 05/27/15 0305  AST 74* 46*  ALT 43 37  ALKPHOS 152* 144*  BILITOT 4.1* 2.7*  PROT 5.7* 5.5*  ALBUMIN 2.2* 1.9*    Recent Labs Lab 22-Jun-2015 2314  LIPASE 35   No results for input(s): AMMONIA in the last 168 hours. CBC:  Recent Labs Lab 06-22-15 1731 05/27/15 0305  WBC 21.0* 22.3*  NEUTROABS 18.4* 18.8*  HGB 14.7 14.1  HCT 45.8 43.7  MCV 98.9 99.3  PLT 329 255   Cardiac Enzymes:  Recent Labs Lab 06-22-15 2314 05/26/15 0514 05/26/15 1110 05/26/15 1647  TROPONINI 0.17* 0.15* 0.12* 0.10*   BNP (last 3 results) No results for input(s): BNP in the last 8760 hours.  ProBNP (last 3 results) No results for input(s): PROBNP in the last 8760 hours.  CBG:  Recent Labs Lab 05/26/15 0106 05/26/15 1309  05/26/15 1706 05/26/15 2156 05/27/15 0742  GLUCAP 135* 114* 129* 118* 136*    Recent Results (from the past 240 hour(s))  Culture, blood (routine x 2)     Status: None (Preliminary result)   Collection Time: 06/22/15  5:19 PM  Result Value Ref Range Status   Specimen Description BLOOD LEFT FOOT  Final   Special Requests BOTTLES DRAWN AEROBIC AND ANAEROBIC  Final   Culture NO GROWTH < 24 HOURS  Final   Report Status PENDING  Incomplete  Culture, blood (routine x 2)     Status: None (Preliminary result)   Collection Time: 2015/06/22  5:19 PM  Result Value Ref Range Status   Specimen Description BLOOD RIGHT HAND  Final   Special Requests BOTTLES DRAWN AEROBIC AND ANAEROBIC 2.5ML  Final   Culture NO GROWTH < 24 HOURS  Final   Report Status PENDING  Incomplete  Urine culture     Status: None (Preliminary result)   Collection Time: 06-22-2015  6:48 PM  Result Value Ref Range Status   Specimen Description URINE, CATHETERIZED  Final   Special Requests NONE  Final   Culture NO GROWTH < 24 HOURS  Final   Report Status PENDING  Incomplete  MRSA PCR Screening     Status: None   Collection Time: 06-22-15 10:03 PM  Result Value Ref Range Status   MRSA by PCR NEGATIVE NEGATIVE Final    Comment:        The GeneXpert  MRSA Assay (FDA approved for NASAL specimens only), is one component of a comprehensive MRSA colonization surveillance program. It is not intended to diagnose MRSA infection nor to guide or monitor treatment for MRSA infections.      Studies: Ct Abdomen Pelvis Wo Contrast  05/15/2015  CLINICAL DATA:  Sepsis and confusion. History of diabetes, hypertension, atrial fibrillation, ischemic heart disease, depression, coronary artery disease, hyperlipidemia, and chronic anticoagulation. EXAM: CT ABDOMEN AND PELVIS WITHOUT CONTRAST TECHNIQUE: Multidetector CT imaging of the abdomen and pelvis was performed following the standard protocol without IV contrast. COMPARISON:  None.  FINDINGS: Visualized lung bases demonstrate diffuse airspace disease in both lungs which may indicate edema, consolidation, or ARDS. Cardiac enlargement. Coronary artery and aortic calcifications. The unenhanced appearance of the liver, spleen, gallbladder, pancreas, adrenal glands, inferior vena cava, and retroperitoneal lymph nodes is unremarkable. Probable parapelvic cyst in the left kidney. No hydronephrosis or hydroureter. No renal stones. Extensive calcification of the abdominal aorta, splenic artery, and superior mesenteric artery. There is an abdominal aortic aneurysm measuring 5.5 cm maximal AP dimension. There is displaced intimal calcification which may indicate dissection. No abnormal retroperitoneal fluid collections. Calcification of the iliac arteries. The stomach, small bowel, and colon are not abnormally distended. No free air or free fluid in the abdomen. Abdominal wall musculature appears intact. Pelvis: Ectatic and calcified iliac and external iliac arteries. The appendix is not identified. Prostate gland is not enlarged. Bladder wall is not thickened. There is a tiny gas bubble within the bladder. This could be due to instrumentation or may indicate infection. Scattered diverticula in the sigmoid colon. No evidence of diverticulitis. No pelvic mass or lymphadenopathy. No free or loculated pelvic fluid collections. Lumbar scoliosis with degenerative changes in the lumbar spine and hips. No destructive bone lesions are appreciated. IMPRESSION: Diffuse airspace disease in both lungs possibly due to edema, consolidation, or ARDS. 5.5 cm abdominal aortic aneurysm with displaced intimal calcification suggesting possibility of dissection. No evidence of rupture. No evidence of bowel obstruction or inflammation. No loculated pelvic fluid collections. Electronically Signed   By: Burman Nieves M.D.   On: 06/04/2015 22:17   Ct Head Wo Contrast  06/03/2015  CLINICAL DATA:  Acute encephalopathy.  Confusion, dementia. History of diabetes, hypertension, atrial fibrillation, ischemic heart disease. Chronic anticoagulation. EXAM: CT HEAD WITHOUT CONTRAST TECHNIQUE: Contiguous axial images were obtained from the base of the skull through the vertex without intravenous contrast. COMPARISON:  05/23/2015 FINDINGS: Diffuse cerebral atrophy. Ventricular dilatation consistent with central atrophy. Low-attenuation changes in the deep white matter consistent with small vessel ischemia. No mass effect or midline shift. No abnormal extra-axial fluid collections. Gray-white matter junctions are distinct. Basal cisterns are not effaced. No evidence of acute intracranial hemorrhage. No depressed skull fractures. Visualized paranasal sinuses and mastoid air cells are not opacified. Polyp or retention cyst in the left nasal passage. Postoperative changes in the left globe. Vascular calcifications. IMPRESSION: No acute intracranial abnormalities. Chronic atrophy and small vessel disease. Retention cyst or polyp in the left nasal passage. Electronically Signed   By: Burman Nieves M.D.   On: 05/22/2015 21:57   Dg Chest Port 1 View  05/26/2015  CLINICAL DATA:  81 year old male, history of altered mental status and possible sepsis. EXAM: PORTABLE CHEST 1 VIEW COMPARISON:  02/14/2010, 05/10/2007 FINDINGS: Cardiomediastinal silhouette projects enlarged from the comparison, potentially segment related by the low lung volumes. Dense atherosclerotic calcifications of the thoracic aorta and the aortic arch. Surgical changes of median sternotomy and CABG.  Interval development of bilateral airspace opacities, involving the upper and lower right lung, and the base of the left lung. No displaced fracture. IMPRESSION: Multifocal pneumonia. Atherosclerosis Signed, Yvone NeuJaime S. Loreta AveWagner, DO [facility] Electronically Signed   By: Gilmer MorJaime  Wagner D.O.   On: Jul 25, 2014 18:22    Scheduled  Meds: . antiseptic oral rinse  7 mL Mouth Rinse BID  . ceFEPime (MAXIPIME) IV  2 g Intravenous Q24H  . collagenase   Topical Daily  . insulin aspart  0-9 Units Subcutaneous TID WC  . ipratropium  0.5 mg Nebulization Q6H  . levalbuterol  1.25 mg Nebulization QID  . metoprolol  5 mg Intravenous 3 times per day  . metoprolol  5 mg Intravenous Once  . pantoprazole (PROTONIX) IV  40 mg Intravenous Q24H  . sodium chloride  500 mL Intravenous Once  . vancomycin  1,250 mg Intravenous Q24H   Continuous Infusions: . dextrose 5 % 1,000 mL infusion 75 mL/hr at 05/26/15 2205    Principal Problem:   Sepsis (HCC) Active Problems:   Atrial fibrillation (HCC)   Pure hypercholesterolemia   Diabetes mellitus without complication (HCC)   Hx of CABG   Aortic stenosis, moderate   HCAP (healthcare-associated pneumonia)   CAD (coronary artery disease)   Acute encephalopathy   Hyperkalemia   AKI (acute kidney injury) (HCC)   Hypernatremia   Abnormal liver function   Elevated troponin   LFT elevation    Time spent: 40 mins    [facility] Memorial HospitalHOMPSON,DANIEL MD [facility] Pager [phone]. If 7PM-7AM, please contact night-coverage at [email or web address], password [email or web address] 05/27/2015, 9:06 AM  LOS: 2 days

## 2015-05-27 NOTE — Progress Notes (Signed)
MD notified of 15 beat run of v tach.

## 2015-05-27 NOTE — Evaluation (Signed)
Physical Therapy Evaluation Patient Details Name: Rodney Montes MRN: [id number] DOB: June 01, 1923 Today's Date: 05/27/2015   History of Present Illness  is a 81 y.o. male with PMH of hyperension, hyperlipidemia, diabetes mellitus, depression, CVA, S/P of CABG, s/p of stent, atrial fibrillation on Coumadin, aortic stenosis, who presents with altered mental status. Found to have PNA and UTI. Also of note is recent fall at SNF  Clinical Impression  Pt admitted with above diagnosis. Pt currently with functional limitations due to the deficits listed below (see PT Problem List). Pt needing total assist today to move with pt very lethargic. Never opened eyes even though PT/OT did sit pt on EOB.  Will continue acute PT and monitor for progress.  Pt will benefit from skilled PT to increase their independence and safety with mobility to allow discharge to the venue listed below.      Follow Up Recommendations SNF;Supervision/Assistance - 24 hour    Equipment Recommendations  Other (comment) (TBA)    Recommendations for Other Services       Precautions / Restrictions Precautions Precautions: Fall Restrictions Weight Bearing Restrictions: No      Mobility  Bed Mobility Overal bed mobility: Needs Assistance;+2 for physical assistance Bed Mobility: Supine to Sit;Sit to Supine     Supine to sit: Total assist;+2 for physical assistance Sit to supine: Total assist;+2 for physical assistance   General bed mobility comments: Pt did not attempt to try and A  Transfers                    Ambulation/Gait                Stairs            Wheelchair Mobility    Modified Rankin (Stroke Patients Only)       Balance Overall balance assessment: Needs assistance;History of Falls Sitting-balance support: No upper extremity supported;Feet supported Sitting balance-Leahy Scale: Zero Sitting balance - Comments: Pt not attempting to try and maintain balance  (shifting all  over the place)                                     Pertinent Vitals/Pain Pain Assessment: Faces Faces Pain Scale: Hurts a little bit Pain Location: unsure Pain Descriptors / Indicators: Grimacing Pain Intervention(s): Limited activity within patient's tolerance;Monitored during session;Repositioned  Pt Hr 65-130 bpm - not sure if monitoring accurately; O2 on 3LO2 was 86-88% therefore turned O2 up to 4LO2 and sats 90-93%- made nurse aware.  BP 119/86 initially and 111/69 post treatment.     Home Living Family/patient expects to be discharged to:: Skilled nursing facility                      Prior Function                 Hand Dominance        Extremity/Trunk Assessment   Upper Extremity Assessment: Defer to OT evaluation           Lower Extremity Assessment: Generalized weakness      Cervical / Trunk Assessment: Kyphotic  Communication   Communication: Expressive difficulties  Cognition Arousal/Alertness: Lethargic Behavior During Therapy: Restless Overall Cognitive Status: No family/caregiver present to determine baseline cognitive functioning (not following commands)  General Comments      Exercises        Assessment/Plan    PT Assessment Patient needs continued PT services  PT Diagnosis Generalized weakness   PT Problem List Decreased activity tolerance;Decreased balance;Decreased mobility;Decreased knowledge of use of DME;Decreased safety awareness;Decreased cognition;Decreased strength;Decreased knowledge of precautions  PT Treatment Interventions DME instruction;Functional mobility training;Therapeutic activities;Therapeutic exercise;Balance training;Patient/family education;Cognitive remediation   PT Goals (Current goals can be found in the Care Plan section) Acute Rehab PT Goals Patient Stated Goal: unable PT Goal Formulation: Patient unable to participate in goal setting Time For Goal  Achievement: 06/10/15 Potential to Achieve Goals: Fair    Frequency Min 2X/week   Barriers to discharge Decreased caregiver support      Co-evaluation PT/OT/SLP Co-Evaluation/Treatment: Yes Reason for Co-Treatment: For patient/therapist safety PT goals addressed during session: Mobility/safety with mobility         End of Session Equipment Utilized During Treatment: Gait belt;Oxygen Activity Tolerance: Patient limited by fatigue Patient left: with call bell/phone within reach;in bed;with bed alarm set;with restraints reapplied Nurse Communication: Mobility status;Need for lift equipment         Time: 0932-0950 PT Time Calculation (min) (ACUTE ONLY): 18 min   Charges:   PT Evaluation $Initial PT Evaluation Tier I: 1 Procedure     PT G CodesBerline Lopes:        Biana Haggar F 05/27/2015, 3:32 PM  [facility] HospitalDawn Maryfer Tauzin,PT Acute Rehabilitation [phone] (pager)

## 2015-05-27 NOTE — Consult Note (Addendum)
WOC wound consult note Reason for Consult: Consult requested for sacrum wound. Condom cath has been applied to contain urine. Wound type: Unstageable pressure injury to sacrum Pressure Ulcer POA: Yes Measurement: 1.8X.2cm Wound bed: 100% yellow slough, tightly adhered Drainage (amount, consistency, odor) Small amt yellowish-green tinged drainage, no odor Periwound: Generalized erythremia surrounding wound. Dressing procedure/placement/frequency: Pt has multiple systemic factors which can impair healing. Santyl ointment to provide enzymatic debridement to nonviable tissue. Pt is confused and agitated and does not understand when wound is discussed.  No family members present in the room at this time. Please re-consult if further assistance is needed.  Thank-you,  Cammie Mcgeeawn Jezebel Pollet MSN, RN, CWOCN, MoradaWCN-AP, CNS [phone]

## 2015-05-27 NOTE — Progress Notes (Signed)
  Echocardiogram 2D Echocardiogram has been performed.  Janalyn HarderWest, Rasheena Talmadge R 05/27/2015, 9:36 AM

## 2015-05-28 ENCOUNTER — Inpatient Hospital Stay (HOSPITAL_COMMUNITY): Payer: Medicare Other

## 2015-05-28 ENCOUNTER — Other Ambulatory Visit: Payer: Self-pay | Admitting: *Deleted

## 2015-05-28 DIAGNOSIS — I714 Abdominal aortic aneurysm, without rupture, unspecified: Secondary | ICD-10-CM

## 2015-05-28 DIAGNOSIS — I739 Peripheral vascular disease, unspecified: Secondary | ICD-10-CM

## 2015-05-28 DIAGNOSIS — L98499 Non-pressure chronic ulcer of skin of other sites with unspecified severity: Secondary | ICD-10-CM

## 2015-05-28 DIAGNOSIS — E119 Type 2 diabetes mellitus without complications: Secondary | ICD-10-CM

## 2015-05-28 DIAGNOSIS — L8990 Pressure ulcer of unspecified site, unspecified stage: Secondary | ICD-10-CM | POA: Insufficient documentation

## 2015-05-28 LAB — BASIC METABOLIC PANEL
Anion gap: 9 (ref 5–15)
Anion gap: 9 (ref 5–15)
BUN: 25 mg/dL — AB (ref 6–20)
BUN: 21 mg/dL — AB (ref 6–20)
CALCIUM: 8.3 mg/dL — AB (ref 8.9–10.3)
CALCIUM: 8 mg/dL — AB (ref 8.9–10.3)
CO2: 28 mmol/L (ref 22–32)
CO2: 26 mmol/L (ref 22–32)
CREATININE: 0.99 mg/dL (ref 0.61–1.24)
CREATININE: 0.92 mg/dL (ref 0.61–1.24)
Chloride: 117 mmol/L — ABNORMAL HIGH (ref 101–111)
Chloride: 116 mmol/L — ABNORMAL HIGH (ref 101–111)
GFR calc Af Amer: >60 mL/min (ref >60)
GFR calc non Af Amer: >60 mL/min (ref >60)
GLUCOSE: 135 mg/dL — AB (ref 65–99)
Glucose, Bld: 146 mg/dL — ABNORMAL HIGH (ref 65–99)
Potassium: 3.3 mmol/L — ABNORMAL LOW (ref 3.5–5.1)
Potassium: 3.8 mmol/L (ref 3.5–5.1)
SODIUM: 151 mmol/L — AB (ref 135–145)
Sodium: 154 mmol/L — ABNORMAL HIGH (ref 135–145)

## 2015-05-28 LAB — CBC
HEMATOCRIT: 45.9 % (ref 39.0–52.0)
Hemoglobin: 14.2 g/dL (ref 13.0–17.0)
MCH: 31.7 pg (ref 26.0–34.0)
MCHC: 30.9 g/dL (ref 30.0–36.0)
MCV: 102.5 fL — AB (ref 78.0–100.0)
Platelets: 301 10*3/uL (ref 150–400)
RBC: 4.48 MIL/uL (ref 4.22–5.81)
RDW: 16.6 % — AB (ref 11.5–15.5)
WBC: 19.8 10*3/uL — ABNORMAL HIGH (ref 4.0–10.5)

## 2015-05-28 LAB — GLUCOSE, CAPILLARY
GLUCOSE-CAPILLARY: 124 mg/dL — AB (ref 65–99)
GLUCOSE-CAPILLARY: 124 mg/dL — AB (ref 65–99)
Glucose-Capillary: 123 mg/dL — ABNORMAL HIGH (ref 65–99)
Glucose-Capillary: 149 mg/dL — ABNORMAL HIGH (ref 65–99)

## 2015-05-28 LAB — MAGNESIUM: Magnesium: 2.2 mg/dL (ref 1.7–2.4)

## 2015-05-28 LAB — PROTIME-INR
INR: 2.78 — ABNORMAL HIGH (ref 0.00–1.49)
Prothrombin Time: 28.9 seconds — ABNORMAL HIGH (ref 11.6–15.2)

## 2015-05-28 MED ORDER — SODIUM CHLORIDE 0.9 % IV BOLUS (SEPSIS)
500.0000 mL | Freq: Once | INTRAVENOUS | Status: AC
  Administered 2015-05-28: 500 mL via INTRAVENOUS

## 2015-05-28 MED ORDER — MORPHINE SULFATE (PF) 2 MG/ML IV SOLN
1.0000 mg | Freq: Once | INTRAVENOUS | Status: AC
  Administered 2015-05-28: 1 mg via INTRAVENOUS
  Filled 2015-05-28: qty 1

## 2015-05-28 MED ORDER — METOPROLOL TARTRATE 1 MG/ML IV SOLN
2.5000 mg | INTRAVENOUS | Status: DC | PRN
  Administered 2015-05-28: 2.5 mg via INTRAVENOUS
  Filled 2015-05-28: qty 5

## 2015-05-28 MED ORDER — DEXTROSE 5 % IV SOLN
1.0000 g | Freq: Three times a day (TID) | INTRAVENOUS | Status: DC
  Administered 2015-05-28 (×2): 1 g via INTRAVENOUS
  Filled 2015-05-28 (×4): qty 1

## 2015-05-28 NOTE — Progress Notes (Signed)
Patient pulling at lines, equipment. Pt combative at staff, restless and agitated in bed. Patient's O2 sats in mid-80's on nasal cannula. Attempted NRB mask - patient will not leave on. Dr. Janee Mornhompson notified - order received for soft wrist restraints. Patient's daughter-in-law at bedside. Patient calmer with family presence. Patient not pulling at tubes and lines with family presence. Patient leaving NRB mask in place. Will hold off on initiating wrist restraints. Will continue to monitor.  Asher Muir- Rain [place],RN

## 2015-05-28 NOTE — Care Management Important Message (Signed)
Important Message  Patient Details  Name: Rodney RakersWayne Montes MRN: [id number] Date of Birth: 10-07-1922   Medicare Important Message Given:  Yes    Hanley HaysDowell, Kaegan Hettich T, RN 05/28/2015, 9:26 AM

## 2015-05-28 NOTE — Progress Notes (Addendum)
VASCULAR LAB PRELIMINARY  ARTERIAL  ABI completed: Bilateral ABIs are non-compressible due to calcified vessels. Right TBI appears WNL, left TBI appears abnormal.    RIGHT    LEFT    PRESSURE WAVEFORM  PRESSURE WAVEFORM  BRACHIAL 131 Triphasic BRACHIAL N/A Triphasic  DP [place] Triphasic DP [place] Biphasic  PT [place] Triphasic PT [place] Biphasic  GREAT TOE 0.88  GREAT TOE 0.63     RIGHT LEFT  ABI Non compressible  Non compressible     Chauncy LeanSturdivant, [place] D, RVT 05/28/2015, 4:10 PM

## 2015-05-28 NOTE — Progress Notes (Addendum)
Pharmacy Antibiotic Follow-up Note  Rodney Montes is a 81 y.o. year-old male admitted on 05/17/2015.  The patient is currently on day 4 of vancomycin and cefepime for PNA/sepsis.  Assessment/Plan: -This patient's current vancomycin will be continued without adjustments-  (Vancomycin 1250mg  IV q24h) -d/t improvement in renal function, Cefepime will be changed to 1g IV q8h  Temp (24hrs), Avg:97.2 F (36.2 C), Min:96.3 F (35.7 C), Max:97.8 F (36.6 C)   Recent Labs Lab 05/20/2015 1731 05/27/15 0305 05/28/15 0257  WBC 21.0* 22.3* 19.8*    Recent Labs Lab 05/26/15 1110 05/26/15 1453 05/26/15 1901 05/27/15 0305 05/28/15 0257  CREATININE 1.23 1.21 1.09 1.20 0.99   Estimated Creatinine Clearance: 57.7 mL/min (by C-G formula based on Cr of 0.99).    Allergies  Allergen Reactions  . Diltiazem Other (See Comments)    Per [facility] 06/06/2015  . Simvastatin Other (See Comments)    Leg/ muscle ache    Antimicrobials this admission: Vanc 12/17>> Cefepime 12/17>>  Levels/dose changes this admission: N/A  Microbiology results: 12/17 Urine - Neg 12/17 Blood - NGTD 12/17 MRSA PCR- neg  SCr improved to 0.99 today, CrCl ~55-7560mL/min,   Thank you for allowing pharmacy to be a part of this patient's care. Rodney Montes, PharmD, BCPS Clinical Pharmacist Pager: [phone] 05/28/2015 2:00 PM

## 2015-05-28 NOTE — Progress Notes (Signed)
   05/28/15 2134  BiPAP/CPAP/SIPAP  BiPAP/CPAP/SIPAP Pt Type Adult  Mask Type Full face mask  Mask Size Medium  Set Rate 12 breaths/min  Respiratory Rate 46 breaths/min (MD AND RN AWARE)  IPAP 16 cmH20  EPAP 8 cmH2O  Oxygen Percent 80 %  Minute Ventilation 38  Leak 10  Peak Inspiratory Pressure (PIP) 20  Tidal Volume (Vt) 831  BiPAP/CPAP/SIPAP BiPAP  Patient Home Equipment No  Auto Titrate No  Press High Alarm 30 cmH2O  Press Low Alarm 5 cmH2O  BiPAP/CPAP /SiPAP Vitals  Pulse Rate (!) 135  Resp (!) 46 (MD AND RN AWARE)  BP (!) 149/93 mmHg  SpO2 95 %  Bilateral Breath Sounds Diminished   Pt placed on BIPAP at this time due to low sats and increased respiratory rate, per MD ORDER. RR remains in the 40s even with BIPAP, MD and RN aware. No new orders at this time; will continue to monitory as necessary.

## 2015-05-28 NOTE — Progress Notes (Signed)
[facility] PROGRESS NOTE  Rodney Montes ZOX:[id number] DOB: 04/03/1923 DOA: 2015-06-02 PCP: Ginette Otto, MD   brief interval history Rodney Montes is a 81 y.o. male with PMH of hypertension, hyperlipidemia, diabetes mellitus, depression, CVA, S/P of CABG, s/p of stent, atrial fibrillation on Coumadin, aortic stenosis, who presents with altered mental status.  Patient has AMS and is unable to provide any medical history, therefore, most of the history is obtained by discussing the case with ED physician, per EMS report, and with the nursing staff. It seems that pt was a resident at [facility], but moved to skilled nursing area 2 days ago following fall. He has increased confusion and combativeness. At baseline pt is oriented to self. He was diagnosed with UTI and started on rocephin today. Pt has respiratory distress in emergency room, needs 4L nasal cannula oxygen to maintain oxygen saturation. When I saw pt, he cannot provide any history. He dose not seem to have cough or diarrhea. He moves all extremities. He is full code per report.   In ED, patient was found to have multifocal pneumonia by chest x-ray, lactate is 3.71, hypotension with blood pressure 89/79 which improved to 105/58 after 1 L of normal saline bolus, hyperkalemia with potassium 5.3 without EKG change, sodium 152, acute renal injury with creatinine 1.56, BUN 51, abnormal liver function with total bilirubin 4.1, AST 74, ALT 43, ALP 152. Pending UA and Ux. Patient is admitted to inpatient for further management and treatment.   Patient was admitted with sepsis with acute encephalopathy pancultured and placed empirically on IV vancomycin and IV cefepime.  Patient also noted to have a severe hypernatremia with some elevated troponins. Patient on D5W and rate has been increased. Cultures still pending. Patient on empiric IV antibiotics.  Patient with no significant improvement in his current status and as such will consult  with palliative care for goals of care.     Assessment/Plan: #1 sepsis Likely secondary to healthcare associated pneumonia. Patient presented with sepsis with a leukocytosis, hypotension, tachycardia, hypothermia, tachypnea with elevated lactic acid level. Patient on admission was hypotensive however blood pressure responding to IV fluids and antibiotics. Influenza PCR negative. Blood cultures pending. Sputum Gram stain and cultures pending. Will continue to trend pro calcitonin lactic acid level. Continue IV fluids. Continue nebulizer treatments. Continue empiric IV vancomycin and cefepime. Follow.  #2 healthcare associated pneumonia Per chest x-ray and CT abdomen and pelvis. Patient had presented with hypotension and hypothermia tachycardia tachypnea with a leukocytosis chest x-ray consistent with a multifocal pneumonia. Patient has been pancultured results are pending. Continue empiric IV vancomycin and IV cefepime. Supportive care.  #3 acute encephalopathy Likely multifactorial secondary to sepsis, healthcare associated pneumonia, hypernatremia, dehydration, acute renal injury. Patient with recent fall was on Coumadin. CT head negative for intracranial bleeding. Patient has a pancultured and results are pending.  Hopefully as hyponatremia resolves hopefully mentation will improv however no significant change at this time. Continue empiric IV vancomycin and IV cefepime. Increase D5W to 125 cc/h. Follow.  #4 hypernatremia Likely secondary to severe dehydration.  No significant improvement with hypernatremia. Increase D5W to 1 25 mL per hour. Give a bolus of normal saline 500 mL 1 as patient looks clinically dehydrated. Follow.  #5 leukocytosis Likely secondary to problem #1 and 2. Patient has been pancultured. Continue empiric IV vancomycin IV cefepime.  #6 hypotension Likely secondary to hypovolemia and sepsis. Patient has been pancultured. Blood pressure responding to IV fluids.  Follow.  #7 atrial fibrillation  CHA2DS2VASC score is 4. Patient was on Coumadin. INR is therapeutic at 2.66. CT head is negative for any intracranial bleed. CT abdomen is negative. Patient's heart rate ranging from 90s to 120s. Continue Lopressor 5 mg IV every 8 hours and titrate as needed. Patient with an allergy to Cardizem which is unknown. Continue to hold Coumadin for now. Follow.  #8 hyperlipidemia LDL was 133 on 11/15/2013. Patient with an allergy to Zocor. Was tolerating oral intake will place back on home regimen.  #9 diabetes mellitus type 2 Hemoglobin A1c 6.5. Hold oral hypoglycemic agents. Sliding scale insulin.  #10 coronary artery disease status post stent/CABG/elevated troponins Cardiac enzymes mildly elevated likely secondary to sepsis. 2-D echo  With EF of 50-55% with moderate aortic valvular stenosis,  Moderate to severe tricuspid valvular regurgitation , moderately dilated right atrium and moderately dilated left atrium unchanged from prior 2-D echo. Continue IV beta blocker. Follow.  #11 hyperkalemia Resolved.  #12 acute renal failure Secondary to prerenal azotemia. Improved with hydration. Follow.  #13 5.5 cm infrarenal abdominal aortic aneurysm Patient has been assessed by vascular surgery and there is no evidence of leak. Per vascular surgery patient is 81 years old, debilitated and not a good candidate for surgery. Family is in agreement. Outpatient follow-up for surveillance ultrasound in 6 months with vascular surgery.  #14 abnormal LFTs and abdominal tenderness Likely secondary to shock liver from hypotension. LFTs trending down. Acute hepatitis panel negative. Follow.  #15 hypertension Diuretics on hold. On IV metoprolol.  #16 prophylaxis SCDs for DVT prophylaxis.   #17 prognosis  patient with multiple medical problems, patient also age 56, patient presenting with acute encephalopathy and sepsis secondary to healthcare associated pneumonia. Patient has  been pancultured. Patient also noted to be severely hypernatremic. Patient on D5W, empiric IV antibiotics, supportive care. Patient with no significant improvement since admission and not sure show, how well patient will do. Will consult with palliative care for goals of care.  Code Status: DO NOT RESUSCITATE Family Communication: Updated daughter Jaimon Bugaj cell phone number [phone] via telephone Disposition Plan: Remain in the step down unit. Continue current treatments. Hopefully of sodium improves maybe mentation might improve. Consult palliative care for goals of care.   Consultants:  Vascular surgery: Dr. Edilia Bo 06-11-15  Procedures:  CT head 2015/06/11  CT abdomen and pelvis 06-11-2015  Chest x-ray 06-11-15  2-D echo 05/27/2015  Antibiotics:  IV vancomycin 2015/06/11  IV cefepime 2015-06-11  HPI/Subjective: Patient is obtunded. Minimal response to noxious stimuli. Patient is restless with some agitation. Patient moans to sternal rub.  Objective: Filed Vitals:   05/28/15 0700 05/28/15 0800  BP: 131/79 152/72  Pulse:  104  Temp:  96.9 F (36.1 C)  Resp: 25 32    Intake/Output Summary (Last 24 hours) at 05/28/15 1030 Last data filed at 05/28/15 0600  Gross per 24 hour  Intake   1800 ml  Output    800 ml  Net   1000 ml   Filed Weights   06/11/15 1709 06/11/2015 2245  Weight: 83.915 kg (185 lb) 83.9 kg (184 lb 15.5 oz)    Exam:   General:  Obtunded  Cardiovascular: Irregularly irregular  Respiratory: Coarse BS anterior lung fields.  Abdomen: Soft, nontender, nondistended, positive bowel sounds.  Musculoskeletal: No clubbing cyanosis or edema. Left ankle wrapped.  Data Reviewed: Basic Metabolic Panel:  Recent Labs Lab 05/26/15 1110 05/26/15 1453 05/26/15 1901 05/27/15 0305 05/28/15 0257  NA 155* 153* 153* 152* 154*  K 3.4*  3.5 3.9 3.8 3.3*  CL 116* 117* 119* 117* 117*  CO2 29 27 22 25 28   GLUCOSE 127* 143* 142* 157* 135*  BUN  40* 39* 37* 35* 25*  CREATININE 1.23 1.21 1.09 1.20 0.99  CALCIUM 8.1* 7.9* 8.0* 8.0* 8.3*  MG  --   --   --  2.2 2.2   Liver Function Tests:  Recent Labs Lab 06/06/2015 1731 05/27/15 0305  AST 74* 46*  ALT 43 37  ALKPHOS 152* 144*  BILITOT 4.1* 2.7*  PROT 5.7* 5.5*  ALBUMIN 2.2* 1.9*    Recent Labs Lab 06/06/2015 2314  LIPASE 35   No results for input(s): AMMONIA in the last 168 hours. CBC:  Recent Labs Lab 06/06/2015 1731 05/27/15 0305 05/28/15 0257  WBC 21.0* 22.3* 19.8*  NEUTROABS 18.4* 18.8*  --   HGB 14.7 14.1 14.2  HCT 45.8 43.7 45.9  MCV 98.9 99.3 102.5*  PLT 329 255 301   Cardiac Enzymes:  Recent Labs Lab 06/06/2015 2314 05/26/15 0514 05/26/15 1110 05/26/15 1647  TROPONINI 0.17* 0.15* 0.12* 0.10*   BNP (last 3 results) No results for input(s): BNP in the last 8760 hours.  ProBNP (last 3 results) No results for input(s): PROBNP in the last 8760 hours.  CBG:  Recent Labs Lab 05/27/15 0742 05/27/15 1215 05/27/15 1740 05/27/15 2129 05/28/15 0832  GLUCAP 136* 115* 124* 133* 124*    Recent Results (from the past 240 hour(s))  Culture, blood (routine x 2)     Status: None (Preliminary result)   Collection Time: 06/06/2015  5:19 PM  Result Value Ref Range Status   Specimen Description BLOOD LEFT FOOT  Final   Special Requests BOTTLES DRAWN AEROBIC AND ANAEROBIC 5ML  Final   Culture NO GROWTH 2 DAYS  Final   Report Status PENDING  Incomplete  Culture, blood (routine x 2)     Status: None (Preliminary result)   Collection Time: 06/06/2015  5:19 PM  Result Value Ref Range Status   Specimen Description BLOOD RIGHT HAND  Final   Special Requests BOTTLES DRAWN AEROBIC AND ANAEROBIC 2.5ML  Final   Culture NO GROWTH 2 DAYS  Final   Report Status PENDING  Incomplete  Urine culture     Status: None   Collection Time: 06/06/2015  6:48 PM  Result Value Ref Range Status   Specimen Description URINE, CATHETERIZED  Final   Special Requests NONE  Final    Culture NO GROWTH 2 DAYS  Final   Report Status 05/27/2015 FINAL  Final  MRSA PCR Screening     Status: None   Collection Time: 06/06/2015 10:03 PM  Result Value Ref Range Status   MRSA by PCR NEGATIVE NEGATIVE Final    Comment:        The GeneXpert MRSA Assay (FDA approved for NASAL specimens only), is one component of a comprehensive MRSA colonization surveillance program. It is not intended to diagnose MRSA infection nor to guide or monitor treatment for MRSA infections.      Studies: No results found.  Scheduled Meds: . antiseptic oral rinse  7 mL Mouth Rinse BID  . ceFEPime (MAXIPIME) IV  2 g Intravenous Q24H  . collagenase   Topical Daily  . insulin aspart  0-9 Units Subcutaneous TID WC  . ipratropium  0.5 mg Nebulization TID  . levalbuterol  1.25 mg Nebulization TID  . metoprolol  5 mg Intravenous 3 times per day  . pantoprazole (PROTONIX) IV  40 mg Intravenous Q24H  .  sodium chloride  500 mL Intravenous Once  . sodium chloride  500 mL Intravenous Once  . vancomycin  1,250 mg Intravenous Q24H   Continuous Infusions: . dextrose 5 % 1,000 mL infusion 125 mL/hr at 05/28/15 0747    Principal Problem:   Sepsis (HCC) Active Problems:   Atrial fibrillation (HCC)   Pure hypercholesterolemia   Diabetes mellitus without complication (HCC)   Hx of CABG   Aortic stenosis, moderate   HCAP (healthcare-associated pneumonia)   CAD (coronary artery disease)   Acute encephalopathy   Hyperkalemia   AKI (acute kidney injury) (HCC)   Hypernatremia   Abnormal liver function   Elevated troponin   LFT elevation   Pressure ulcer    Time spent: 40 mins    [facility] MD [facility] Pager [phone]. If 7PM-7AM, please contact night-coverage at [email or web address], password [facility] 05/28/2015, 10:30 AM  LOS: 3 days

## 2015-05-28 NOTE — Progress Notes (Signed)
Palliative:  Tentative meeting scheduled for 06/02/2015 1600-1630 tomorrow with family to discuss GOC and planning. Thank you for this consult.   Rodney ChannelAlicia Delrick Dehart, NP Palliative Medicine Team Pager # [phone] (M-F 8a-5p) Team Phone # [phone] (Nights/Weekends)

## 2015-05-28 NOTE — Progress Notes (Signed)
O2 sats 83-89 with respirations in the 30s-40s. Craige CottaKirby, NP notified and order for bipap placed. No medications ordered to calm agitation or restlessness at this time.

## 2015-05-29 ENCOUNTER — Telehealth: Payer: Self-pay | Admitting: Vascular Surgery

## 2015-05-29 DIAGNOSIS — K7689 Other specified diseases of liver: Secondary | ICD-10-CM

## 2015-05-29 MED ORDER — LORAZEPAM 2 MG/ML IJ SOLN
1.0000 mg | Freq: Four times a day (QID) | INTRAMUSCULAR | Status: DC | PRN
  Filled 2015-05-29: qty 1

## 2015-05-29 MED ORDER — MORPHINE SULFATE (PF) 2 MG/ML IV SOLN
2.0000 mg | INTRAVENOUS | Status: AC
  Administered 2015-05-29: 2 mg via INTRAVENOUS
  Filled 2015-05-29: qty 1

## 2015-05-29 MED ORDER — SODIUM CHLORIDE 0.9 % IV SOLN
2.0000 mg/h | INTRAVENOUS | Status: DC
  Administered 2015-05-29: 2 mg/h via INTRAVENOUS
  Filled 2015-05-29: qty 10

## 2015-05-29 MED ORDER — ATROPINE SULFATE 1 % OP SOLN
2.0000 [drp] | OPHTHALMIC | Status: DC | PRN
  Filled 2015-05-29: qty 2

## 2015-05-29 MED ORDER — LORAZEPAM 2 MG/ML IJ SOLN: 1.0000 mg | Freq: Once | INTRAMUSCULAR | Status: DC

## 2015-05-29 MED ORDER — MORPHINE SULFATE (PF) 2 MG/ML IV SOLN
2.0000 mg | INTRAVENOUS | Status: AC
  Administered 2015-05-29: 2 mg via INTRAVENOUS

## 2015-05-29 MED ORDER — MORPHINE SULFATE (PF) 2 MG/ML IV SOLN
1.0000 mg | INTRAVENOUS | Status: DC | PRN
  Administered 2015-05-29: 1 mg via INTRAVENOUS
  Filled 2015-05-29 (×2): qty 1

## 2015-05-29 MED ORDER — LORAZEPAM 2 MG/ML IJ SOLN
1.0000 mg | INTRAMUSCULAR | Status: DC | PRN
  Administered 2015-05-29: 1 mg via INTRAVENOUS

## 2015-05-30 LAB — CULTURE, BLOOD (ROUTINE X 2)
CULTURE: NO GROWTH
Culture: NO GROWTH

## 2015-06-09 NOTE — Progress Notes (Addendum)
Shift event: Brief HPI: 82 yo WM admitted with multifocal PNA, sepsis, encephalopathy and severe hypernatremia. Hx CAD and Afib among others.  Tonight, pt desatted and was progressed from [place] to NRB at 100% and was still only satting in the high 80s. Had pretty severe increased WOB and agitation, so pt placed on Bipap. Even with Bipap, pt continued to have increased WOB and at times his O2 sat would drop. Tried giving some morphine, but continued to look uncomfortable and be agitated with the mask.  Earlier, around 2330 hrs, this NP spoke to his daughter, Vernona RiegerLaura about situation. It was already decided that palliative care meeting would take place 12/21 with family to establish goals of care. Pt is a DNR/DNI but has worsened greatly tonight and appears to be struggling and suffering. After this was relayed to Vernona RiegerLaura, she asked if we could sedate him on the Bipap long enough for [place] from [place] good[place]bye to her father. Pt's son, lives in [place], and came to hospital almost immediately. After family arrived, we discussed options at this point. The daughter and the son share HCPOA. Both agree to withdraw care at this time and make the pt comfort care. They both expressed the desire NOT to cause more discomfort or suffering. All meds, labs, procedures, antibiotics, tele, routine VS, other meds, O2, and Bipap d/c'd at family request. Morphine and Ativan given push and will start comfort care morphine drip.  Family is coping well. Emotional support offered. Will offer chaplain. Continue to monitor for comfort and titrate meds as needed.  Jimmye NormanKaren Kirby-Graham, NP [facility] Update: Pt's grandson also arrived. Pt is resting more comfortably after Morphine 4mg  and Ativan 1mg . Morphine drip to be hung shortly. Family declined chaplain stating they had their own pastor.  KJKG, NP [facility] Update: Pt expired at 0336. Death certificate completed and given to RN, Kriste BasqueBecky. Family support given. Family  remembering and showing this NP pictures of happy times with their father. Pt caught a shark on his 89th birthday this year! KJKG, NP [facility]

## 2015-06-09 NOTE — Progress Notes (Signed)
Family called RN into room d/t pt not breathing. Tyler PitaBecky Reap, RN and ChelseaMisty Ennis, [place] listened for apical heartbeat for 1 full minute each, no sounds heard. Time of death pronounced at [id number].

## 2015-06-09 NOTE — Telephone Encounter (Signed)
-----   Message from Sharee PimpleMarilyn K McChesney, RN sent at 05/28/2015 10:59 AM EST ----- Regarding: schedule   ----- Message -----    From: Raymond GurneyKimberly A Trinh, PA-C    Sent: 05/28/2015  10:44 AM      [facility]: [facility]  Hospital consult AAA  Will need AAA duplex and appointment with Dr. Edilia Boickson in 6 months.   Thanks Selena BattenKim

## 2015-06-09 NOTE — Progress Notes (Signed)
Morphine gtt for end of life care. Remainder of morphine gtt wasted with Julious OkaKarimah Abdussalaam, RN.

## 2015-06-09 NOTE — Progress Notes (Signed)
Pt removed from BIPAP at this time per MD orders and family request as patient is now comfort care.

## 2015-06-09 NOTE — Telephone Encounter (Signed)
Patient is deceased. dpm

## 2015-06-09 DEATH — deceased

## 2015-07-10 NOTE — Discharge Summary (Signed)
Death Summary  Rodney Montes ZOX:[id number] DOB: Jan 29, 1923 DOA: 06-18-2015  PCP: Ginette Otto, MD PCP/Office notified:  Admit date: June 18, 2015 Date of Death: 22-Jun-2015  Final Diagnoses:  Principal Problem:   Sepsis (HCC) Active Problems:   Atrial fibrillation (HCC)   Pure hypercholesterolemia   Diabetes mellitus without complication (HCC)   Hx of CABG   Aortic stenosis, moderate   HCAP (healthcare-associated pneumonia)   CAD (coronary artery disease)   Acute encephalopathy   Hyperkalemia   AKI (acute kidney injury) (HCC)   Hypernatremia   Abnormal liver function   Elevated troponin   LFT elevation   Pressure ulcer     History of present illness:  Per Dr Toy Care is a 82 y.o. male with PMH of hypertension, hyperlipidemia, diabetes mellitus, depression, CVA, S/P of CABG, s/p of stent, atrial fibrillation on Coumadin, aortic stenosis, who presented with altered mental status.  Patient had AMS and was unable to provide any medical history, therefore, most of the history was obtained by discussing the case with ED physician, per EMS report, and with the nursing staff. It seemed that pt was a resident at [facility], but moved to skilled nursing area 2 days prior to admission, following fall. He had increased confusion and combativeness. At baseline pt is oriented to self. He was diagnosed with UTI and started on rocephin  On the day of admission. Pt had respiratory distress in emergency room, needed 4L nasal cannula oxygen to maintain oxygen saturation. When  Admitting M.D. saw pt, he could not provide any history. He did not seem to have cough or diarrhea. He moved all extremities. He was full code per report.   In ED, patient was found to have multifocal pneumonia by chest x-ray, lactate is 3.71, hypotension with blood pressure 89/79 which improved to 105/58 after 1 L of normal saline bolus, hyperkalemia with potassium 5.3 without EKG change, sodium 152, acute  renal injury with creatinine 1.56, BUN 51, abnormal liver function with total bilirubin 4.1, AST 74, ALT 43, ALP 152. Pending UA and Ux. Patient was admitted to inpatient for further management and treatment.  Hospital Course:  #1 sepsis Likely secondary to healthcare associated pneumonia. Patient presented with sepsis with a leukocytosis, hypotension, tachycardia, hypothermia, tachypnea with elevated lactic acid level. Patient on admission was hypotensive however blood pressure responding to IV fluids and antibiotics. Influenza PCR negative. Blood cultures  Were ordered. Sputum Gram stain and cultures were ordered. Patient was placed on IV fluids, nebulizer treatments, empiric IV vancomycin and IV cefepime. Patient was also placed on the BiPAP as needed. Patient did not improve clinically and a such a palliative care consultation was placed. Palliative care contacted family and patient was postop have a family meeting however due to patient's worsening condition and deterioration family was called in. Patient was made comfortable. Patient was placed on a morphine drip. Patient was pronounced dead at 3:36 AM on 06/22/15. May his soul rest in peace.  #2 healthcare associated pneumonia Per chest x-ray and CT abdomen and pelvis. Patient had presented with hypotension and hypothermia tachycardia tachypnea with a leukocytosis chest x-ray consistent with a multifocal pneumonia. Patient has been pancultured and placed empirically on IV vancomycin and IV cefepime.  #3 acute encephalopathy Likely multifactorial secondary to sepsis, healthcare associated pneumonia, hypernatremia, dehydration, acute renal injury. Patient with recent fall was on Coumadin. CT head negative for intracranial bleeding. Patient has a pancultured and  Placed empirically on IV antibiotics of vancomycin and cefepime. Patient was  also placed on D5W for hypernatremia. Patient's condition worsened he deteriorated and with no significant  improvement palliative care consultation was ordered. Goals of care meeting was arranged however due to weight patient's worsening condition he was made comfortable family was informed who came to his bedside. Patient was pronounced dead at 3:36 AM on May 31, 2015.  #4 hypernatremia Likely secondary to severe dehydration. No significant improvement with hypernatremia.  Patient was placed on D5W as well as hydrated with IV fluids.  #5 leukocytosis Likely secondary to problem #1 and 2. Patient has been pancultured.  Patient was placed empirically on IV vancomycin IV cefepime.  #6 hypotension Likely secondary to hypovolemia and sepsis. Patient has been pancultured. Blood pressure responding to IV fluids.  Patient was placed empirically on IV antibiotics. Patient unfortunately did not improve and he tolerated during the hospitalization. Family was informed palliative care consultation was placed. Patient was posted have a family meeting for goals of care. Patient however condition worsened and family was called in. Patient was made comfortable. Patient will was pronounced dead at 3:36 AM on May 31, 2015.  #7 atrial fibrillation CHA2DS2VASC score is 4. Patient was on Coumadin. INR is therapeutic at 2.66. CT head is negative for any intracranial bleed. CT abdomen is negative. Patient's heart rate ranging from 90s to 120s.  Patient was maintained on IV Lopressor for rate control. Patient's anticoagulation was held.  #8 hyperlipidemia LDL was 133 on 11/15/2013.  #9 diabetes mellitus type 2 Hemoglobin A1c 6.5.  Oral hypoglycemic agents were held throughout the hospitalization. Patient was maintained on a sliding scale insulin.  #10 coronary artery disease status post stent/CABG/elevated troponins Cardiac enzymes mildly elevated likely secondary to sepsis. 2-D echo With EF of 50-55% with moderate aortic valvular stenosis, Moderate to severe tricuspid valvular regurgitation , moderately dilated right atrium  and moderately dilated left atrium unchanged from prior 2-D echo.  Patient was maintained on IV beta blocker. Patient's condition worsened during the hospitalization patient had no significant improvement patient deteriorated. Patient was made comfortable. Patient was pronounced dead at 3:36 AM on May 31, 2015.  #11 hyperkalemia Resolved.  #12 acute renal failure Secondary to prerenal azotemia. Improved with hydration.   #13 5.5 cm infrarenal abdominal aortic aneurysm Patient has been assessed by vascular surgery and there is no evidence of leak. Per vascular surgery patient is 82 years old, debilitated and not a good candidate for surgery. Family is in agreement.  Patient was to follow-up in the outpatient setting for surveillance however patient's condition worsened and he deteriorated and was pronounced dead at 3:36 AM on 05/31/2015.  #14 abnormal LFTs and abdominal tenderness Likely secondary to shock liver from hypotension.  Acute hepatitis panel was obtained which came back negative. Patient was hydrated with IV fluid with some improvement in  Hepatic function. Patient however continued to deteriorate and his condition worsened.  Patient was pronounced dead 3:36 AM on 2015-05-31.  #15 hypertension Diuretics  Were held and patient was maintained on IV metoprolol.  #16 prognosis patient with multiple medical problems, patient also age 4, patient presenting with acute encephalopathy and sepsis secondary to healthcare associated pneumonia. Patient was pancultured. Patient also noted to be severely hypernatremic. Patient on D5W, empiric IV antibiotics, supportive care. Patient with no significant improvement since admission and continued to deteriorate.  Discussions were done over the telephone with patient's daughter who lives in [place] was healthcare power of attorney and was felt that patient had a poor prognosis and thoughts were patient may not survive this hospitalization.  Palliative care  consultation was placed and arrangements were made for family meeting. However patient continued to deteriorate and had to be placed on the BiPAP. Family was called and came to the bedside.  Patient was made comfortable and placed on a morphine drip. Patient was pronounced dead at 3:36 AM on 06/04/2015. May his soul rest in peace.    Time: 60 mins  Signed:  THOMPSON,DANIEL  [facility] 06/19/2015, 9:18 PM

## 2015-10-15 ENCOUNTER — Ambulatory Visit: Payer: Medicare Other | Admitting: Nurse Practitioner

## 2016-11-03 IMAGING — CT CT ABD-PELV W/O CM
2 of 4 series · 15 of 46 positions shown, 17 images · non-contrast
Comparison: None.

CLINICAL DATA: Sepsis and confusion. History of diabetes,
hypertension, atrial fibrillation, ischemic heart disease,
depression, coronary artery disease, hyperlipidemia, and chronic
anticoagulation.

EXAM:
CT ABDOMEN AND PELVIS WITHOUT CONTRAST
TECHNIQUE: Multidetector CT imaging of the abdomen and pelvis was performed
following the standard protocol without IV contrast.

[Series 2: abd/ pelvis 5.0 i30f 1 · axial · 0.83mm/px · z∈[-959,-474]mm · 12 of 107 slices shown, 14 images]
[im 5/107  soft-tissue]
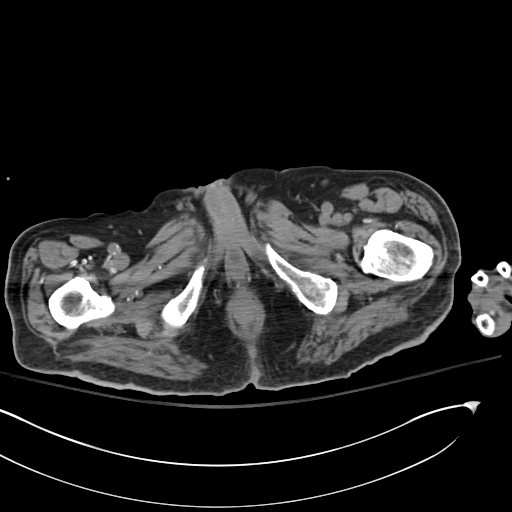
[im 5/107  bone]
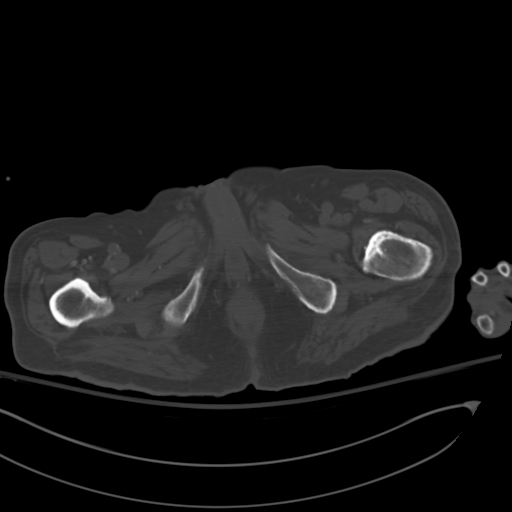
[im 14/107  soft-tissue]
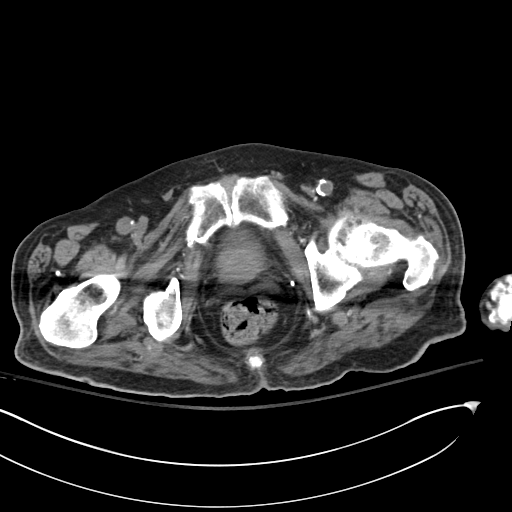
[im 23/107  soft-tissue]
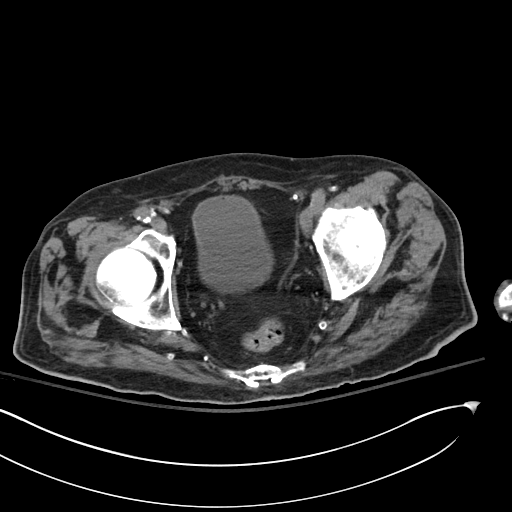
[im 31/107  soft-tissue]
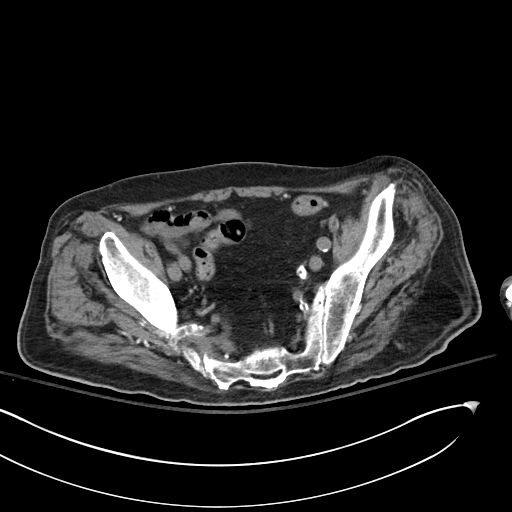
[im 40/107  soft-tissue]
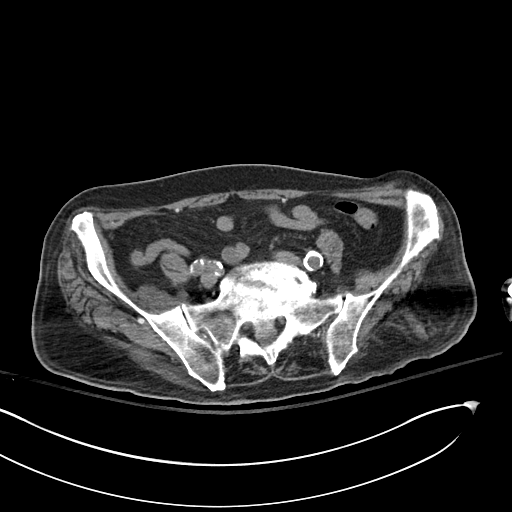
[im 49/107  soft-tissue]
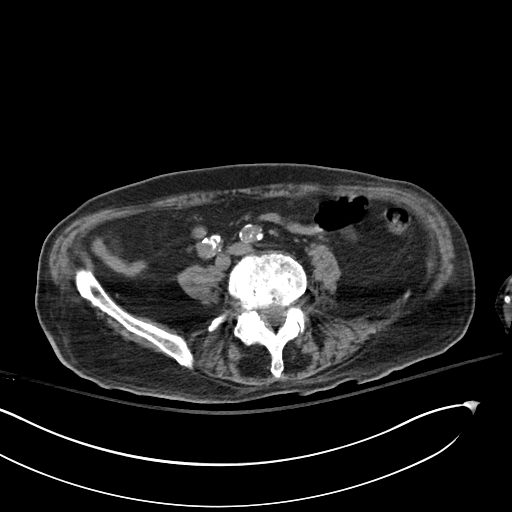
[im 58/107  soft-tissue]
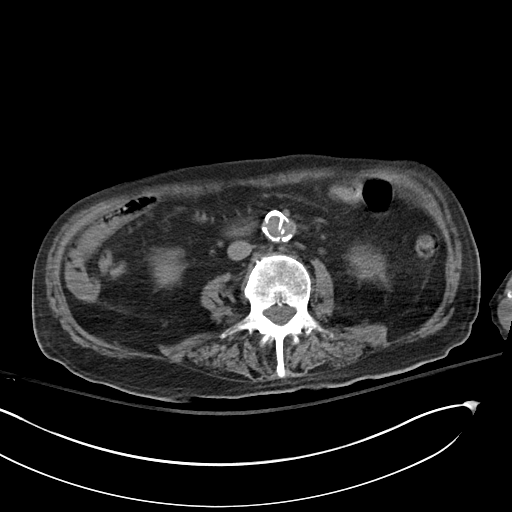
[im 67/107  soft-tissue]
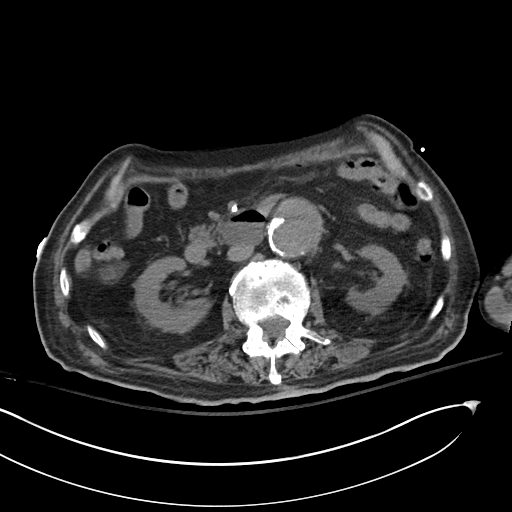
[im 76/107  soft-tissue]
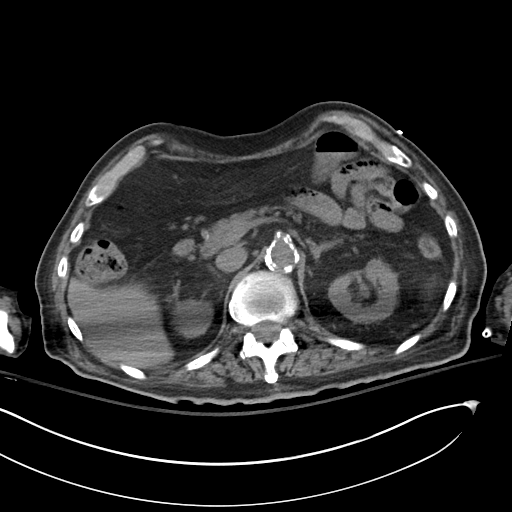
[im 76/107  bone]
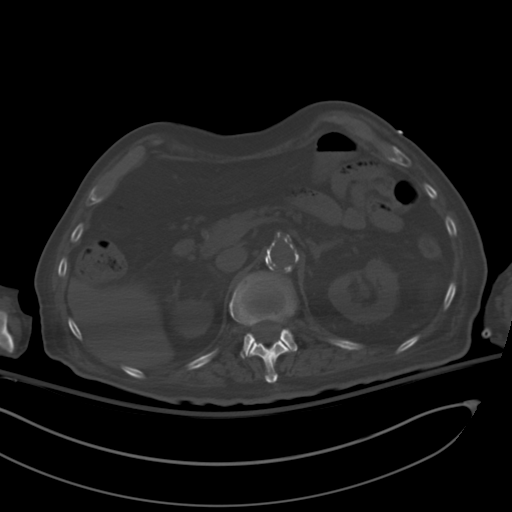
[im 84/107  soft-tissue]
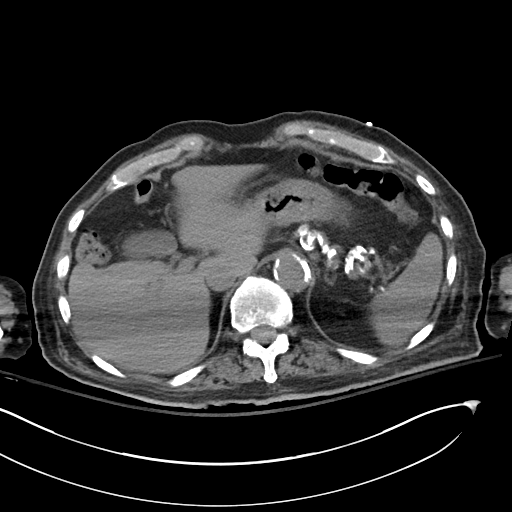
[im 93/107  soft-tissue]
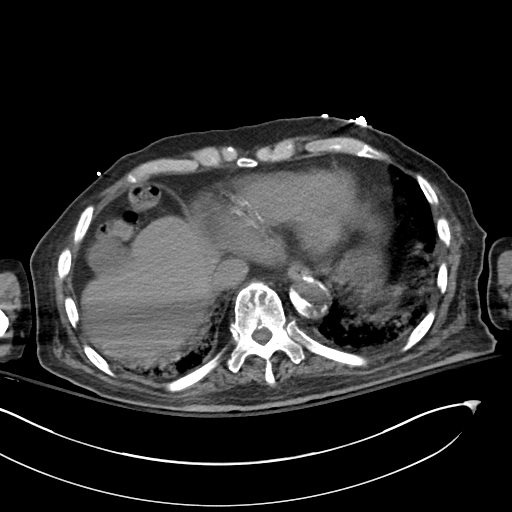
[im 102/107  soft-tissue]
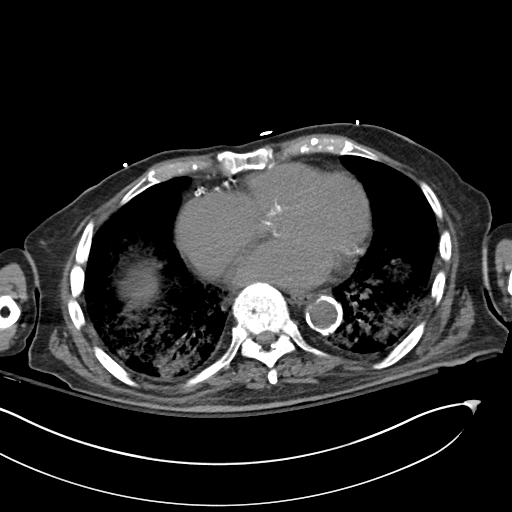

[Series 5: cor st · coronal · 0.83mm/px · 3 of 77 slices shown]
[im 26/77  soft-tissue]
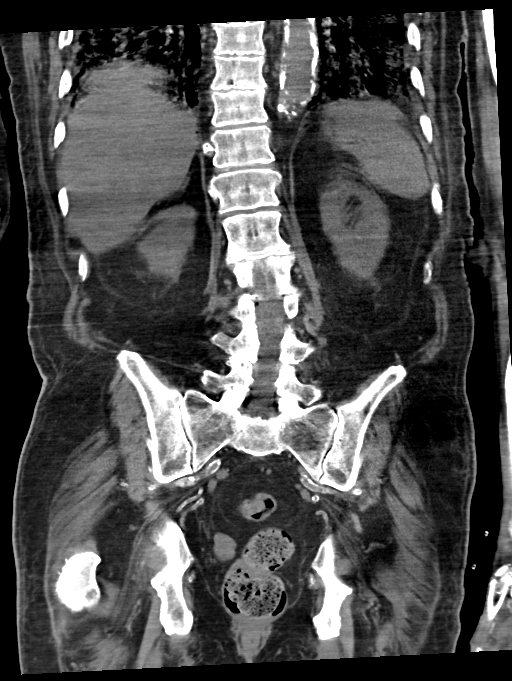
[im 34/77  soft-tissue]
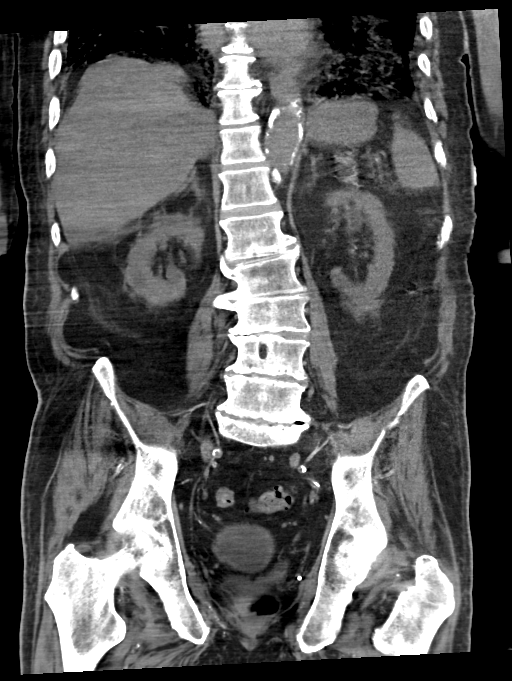
[im 43/77  soft-tissue]
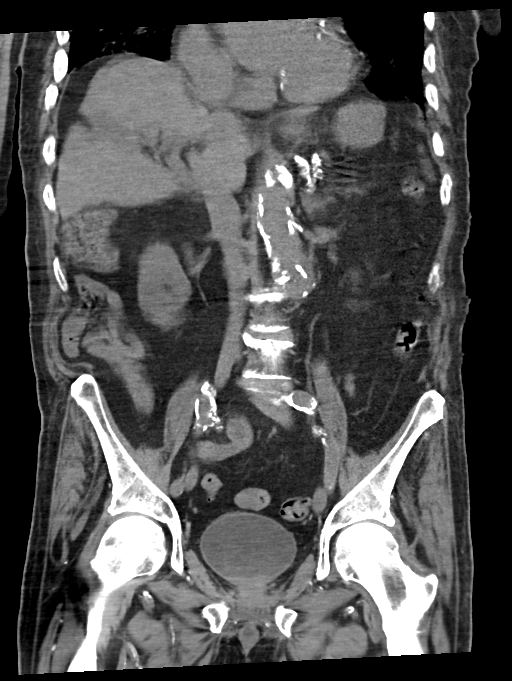

[15 of 46 positions shown; findings below may reference images not displayed]

FINDINGS: Visualized lung bases demonstrate diffuse airspace disease in both
lungs which may indicate edema, consolidation, or ARDS. Cardiac
enlargement. Coronary artery and aortic calcifications.

The unenhanced appearance of the liver, spleen, gallbladder,
pancreas, adrenal glands, inferior vena cava, and retroperitoneal
lymph nodes is unremarkable. Probable parapelvic cyst in the left
kidney. No hydronephrosis or hydroureter. No renal stones. Extensive
calcification of the abdominal aorta, splenic artery, and superior
mesenteric artery. There is an abdominal aortic aneurysm measuring
5.5 cm maximal AP dimension. There is displaced intimal
calcification which may indicate dissection. No abnormal
retroperitoneal fluid collections. Calcification of the iliac
arteries. The stomach, small bowel, and colon are not abnormally
distended. No free air or free fluid in the abdomen. Abdominal wall
musculature appears intact.

Pelvis: Ectatic and calcified iliac and external iliac arteries. The
appendix is not identified. Prostate gland is not enlarged. Bladder
wall is not thickened. There is a tiny gas bubble within the
bladder. This could be due to instrumentation or may indicate
infection. Scattered diverticula in the sigmoid colon. No evidence
of diverticulitis. No pelvic mass or lymphadenopathy. No free or
loculated pelvic fluid collections. Lumbar scoliosis with
degenerative changes in the lumbar spine and hips. No destructive
bone lesions are appreciated.
IMPRESSION: Diffuse airspace disease in both lungs possibly due to edema,
consolidation, or ARDS.

5.5 cm abdominal aortic aneurysm with displaced intimal
calcification suggesting possibility of dissection. No evidence of
rupture.

No evidence of bowel obstruction or inflammation. No loculated
pelvic fluid collections.

## 2016-11-03 IMAGING — CT CT HEAD W/O CM
1 of 2 series · 15 of 30 positions shown, 19 images · non-contrast
Comparison: 05/23/2015

CLINICAL DATA: Acute encephalopathy. Confusion, dementia. History
of diabetes, hypertension, atrial fibrillation, ischemic heart
disease. Chronic anticoagulation.

EXAM:
CT HEAD WITHOUT CONTRAST
TECHNIQUE: Contiguous axial images were obtained from the base of the skull
through the vertex without intravenous contrast.

[Series 2: head 2.0 h70h · axial · 0.44mm/px · z∈[-117,+29]mm · 15 of 83 slices shown, 19 images]
[im 5/83  brain]
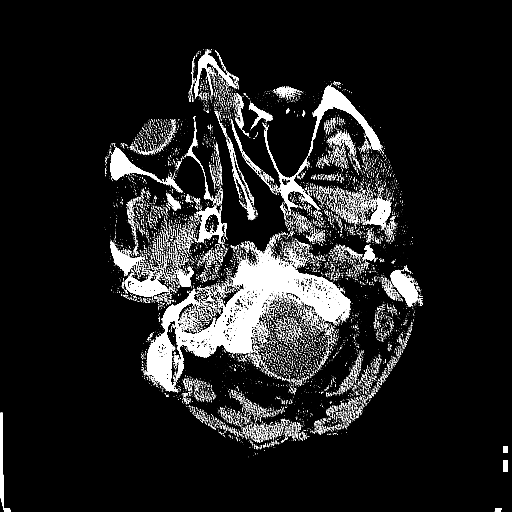
[im 5/83  bone]
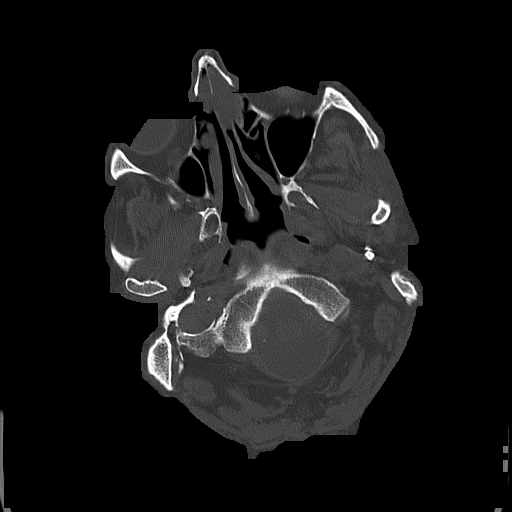
[im 9/83  brain]
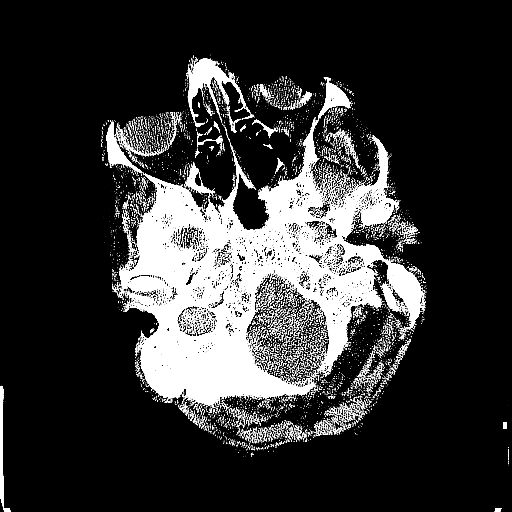
[im 17/83  brain]
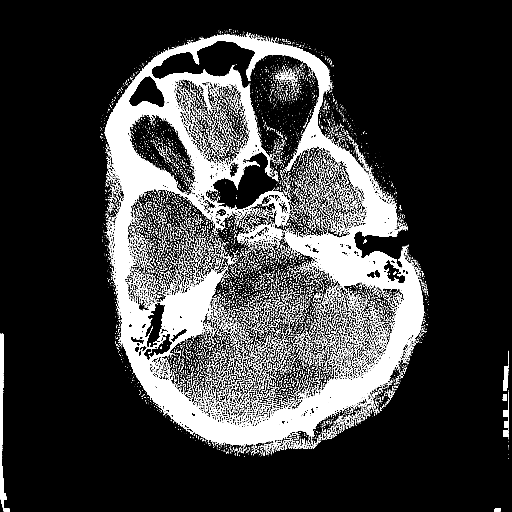
[im 21/83  brain]
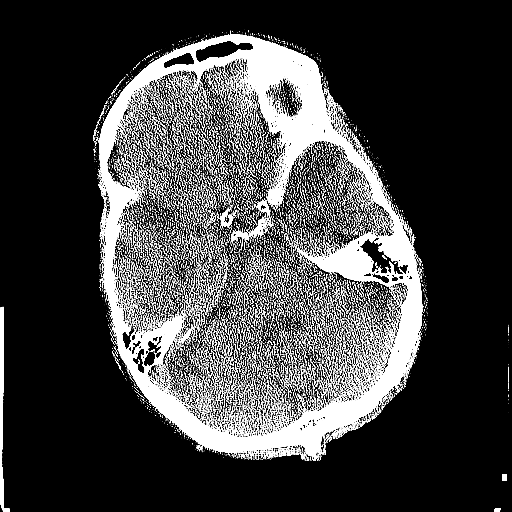
[im 25/83  brain]
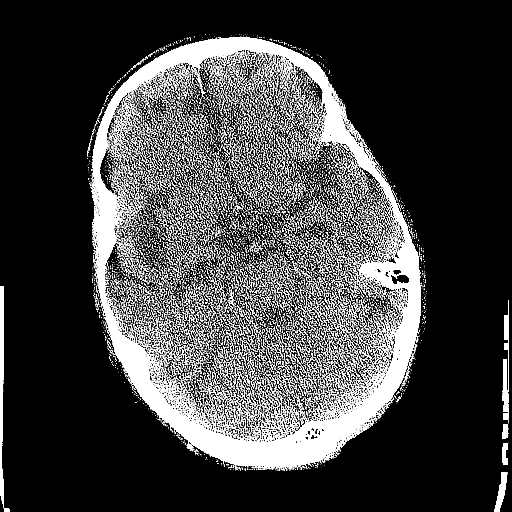
[im 25/83  bone]
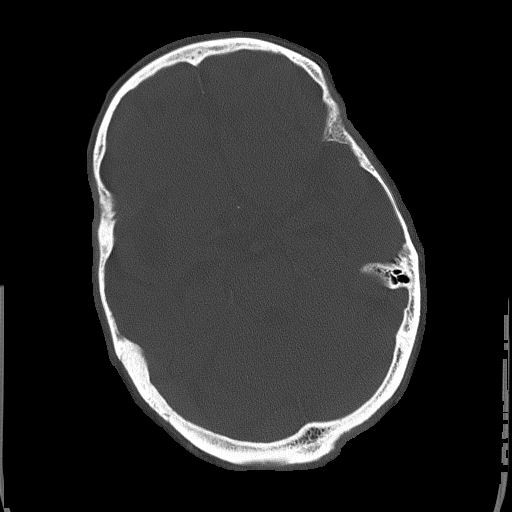
[im 29/83  brain]
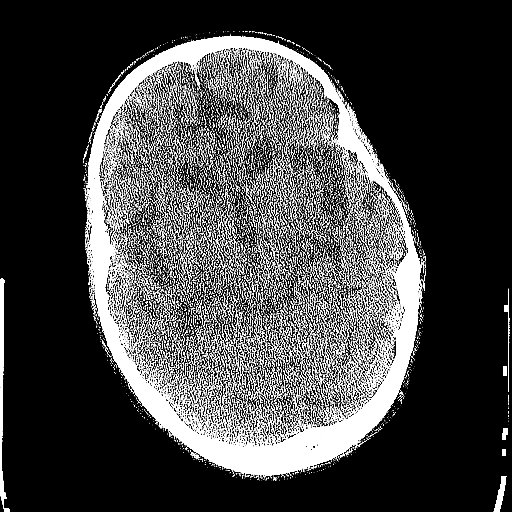
[im 37/83  brain]
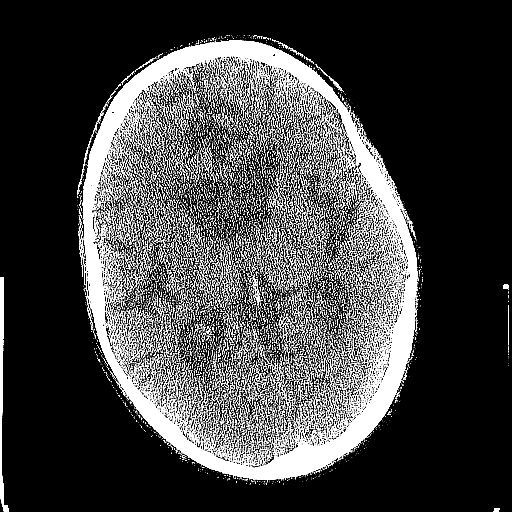
[im 42/83  brain]
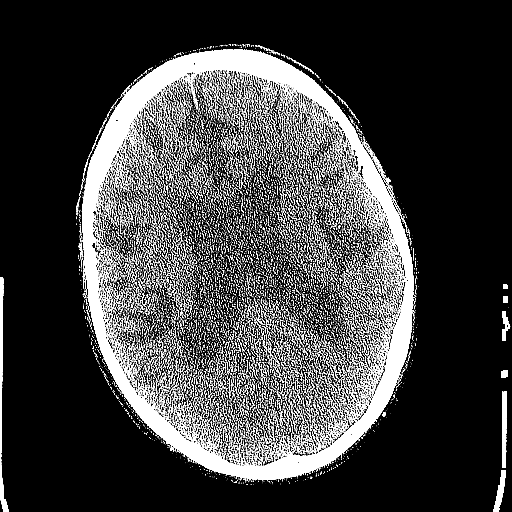
[im 46/83  brain]
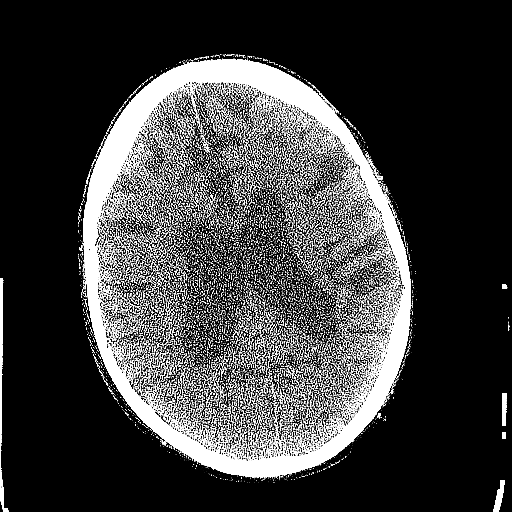
[im 46/83  bone]
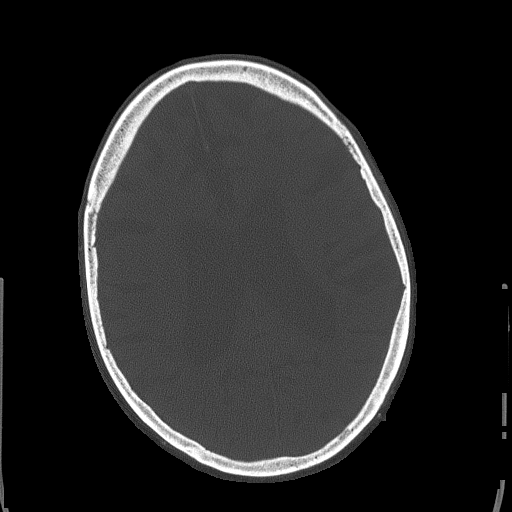
[im 54/83  brain]
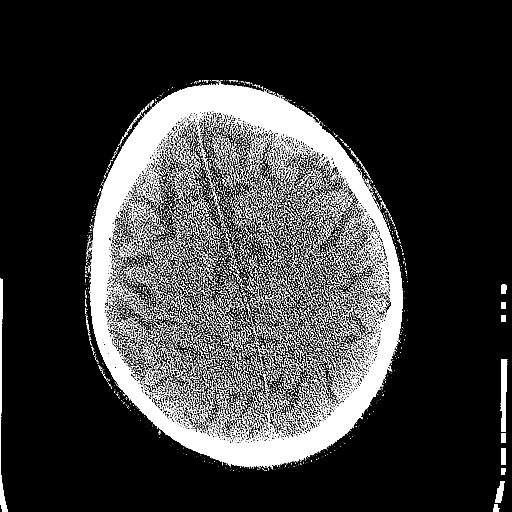
[im 58/83  brain]
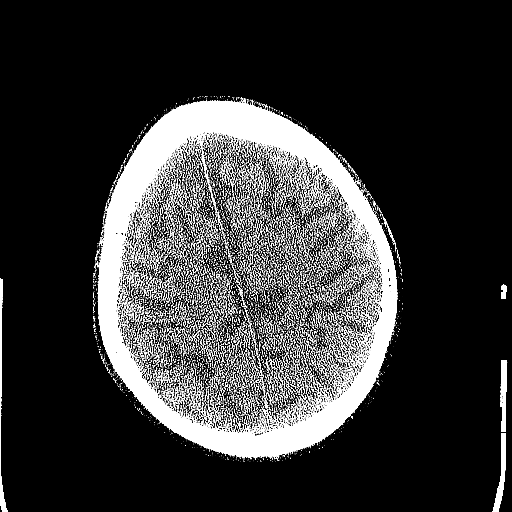
[im 62/83  brain]
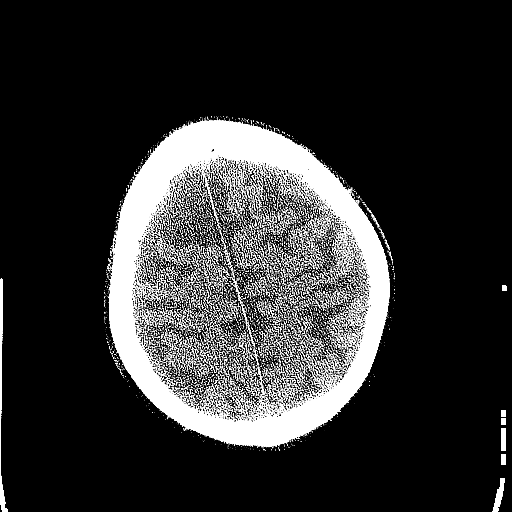
[im 66/83  brain]
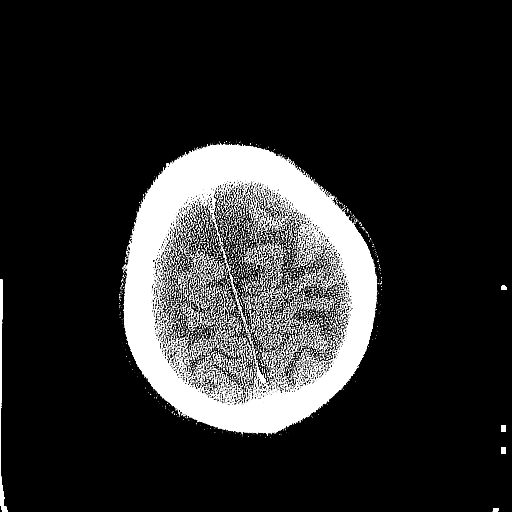
[im 66/83  bone]
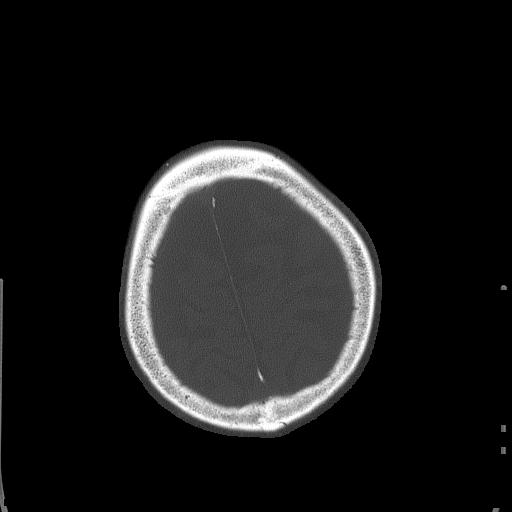
[im 74/83  brain]
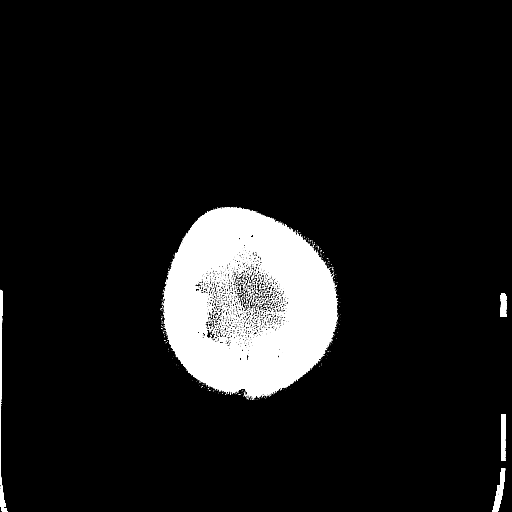
[im 78/83  brain]
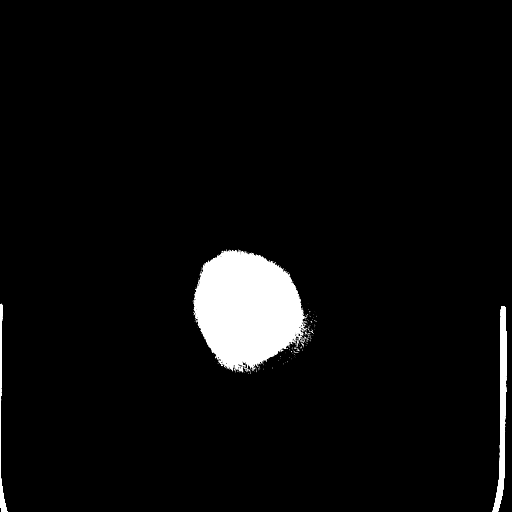

[15 of 30 positions shown; findings below may reference images not displayed]

FINDINGS: Diffuse cerebral atrophy. Ventricular dilatation consistent with
central atrophy. Low-attenuation changes in the deep white matter
consistent with small vessel ischemia. No mass effect or midline
shift. No abnormal extra-axial fluid collections. Gray-white matter
junctions are distinct. Basal cisterns are not effaced. No evidence
of acute intracranial hemorrhage. No depressed skull fractures.
Visualized paranasal sinuses and mastoid air cells are not
opacified. Polyp or retention cyst in the left nasal passage.
Postoperative changes in the left globe. Vascular calcifications.
IMPRESSION: No acute intracranial abnormalities. Chronic atrophy and small
vessel disease. Retention cyst or polyp in the left nasal passage.
